# Patient Record
Sex: Female | Born: 1980
Health system: Southern US, Community
[De-identification: ages and names within clinical notes are randomized; demographics above are authoritative.]

## PROBLEM LIST (undated history)

## (undated) DIAGNOSIS — C50919 Malignant neoplasm of unspecified site of unspecified female breast: Secondary | ICD-10-CM

## (undated) DIAGNOSIS — N879 Dysplasia of cervix uteri, unspecified: Secondary | ICD-10-CM

## (undated) DIAGNOSIS — Z923 Personal history of irradiation: Secondary | ICD-10-CM

## (undated) HISTORY — PX: INTRAUTERINE DEVICE (IUD) INSERTION: SHX5877

## (undated) HISTORY — PX: REDUCTION MAMMAPLASTY: SUR839

## (undated) HISTORY — PX: OTHER SURGICAL HISTORY: SHX169

---
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
## 2009-02-05 ENCOUNTER — Ambulatory Visit: Payer: Self-pay | Admitting: Obstetrics and Gynecology

## 2009-02-05 ENCOUNTER — Encounter: Payer: Self-pay | Admitting: Obstetrics and Gynecology

## 2009-02-05 ENCOUNTER — Other Ambulatory Visit: Admission: RE | Admit: 2009-02-05 | Discharge: 2009-02-05 | Payer: Self-pay | Admitting: Obstetrics and Gynecology

## 2009-02-23 ENCOUNTER — Ambulatory Visit: Payer: Self-pay | Admitting: Obstetrics and Gynecology

## 2011-10-14 HISTORY — PX: OTHER SURGICAL HISTORY: SHX169

## 2011-12-24 ENCOUNTER — Ambulatory Visit: Payer: Self-pay | Admitting: Family Medicine

## 2012-02-27 ENCOUNTER — Encounter: Payer: Self-pay | Admitting: Gynecology

## 2012-03-02 ENCOUNTER — Encounter: Payer: Self-pay | Admitting: Obstetrics and Gynecology

## 2012-03-02 ENCOUNTER — Other Ambulatory Visit (HOSPITAL_COMMUNITY)
Admission: RE | Admit: 2012-03-02 | Discharge: 2012-03-02 | Disposition: A | Discharge status: Home / Self Care | Payer: BC Managed Care – PPO | Source: Ambulatory Visit | Attending: Obstetrics and Gynecology | Admitting: Obstetrics and Gynecology

## 2012-03-02 ENCOUNTER — Ambulatory Visit (INDEPENDENT_AMBULATORY_CARE_PROVIDER_SITE_OTHER): Payer: BC Managed Care – PPO | Admitting: Obstetrics and Gynecology

## 2012-03-02 VITALS — BP 112/74 | Ht 64.0 in | Wt 167.0 lb

## 2012-03-02 DIAGNOSIS — Z30431 Encounter for routine checking of intrauterine contraceptive device: Secondary | ICD-10-CM

## 2012-03-02 DIAGNOSIS — Z01419 Encounter for gynecological examination (general) (routine) without abnormal findings: Secondary | ICD-10-CM

## 2012-03-02 HISTORY — PX: IUD REMOVAL: SHX5392

## 2012-03-02 NOTE — Progress Notes (Signed)
Patient came to see me today for her annual GYN exam. She is happy with her Mirena IUD with minimal bleeding. However she and her husband are ready to start a family. She requested that it be removed. She is having no abnormal bleeding. She is having no pelvic pain. She is not having dyspareunia.  Physical examination: Kennon Portela present HEENT within normal limits. Neck: Thyroid not large. No masses. Supraclavicular nodes: not enlarged. Breasts: Examined in both sitting and lying  position. No skin changes and no masses. Abdomen: Soft no guarding rebound or masses or hernia. Pelvic: External: Within normal limits. BUS: Within normal limits. Vaginal:within normal limits. Good estrogen effect. No evidence of cystocele rectocele or enterocele. Cervix: clean. IUD string visible.  Uterus: Normal size and shape. Adnexa: No masses. Rectovaginal exam: Confirmatory and negative. Extremities: Within normal limits.  Assessment: Normal GYN exam  Plan: Mirena IUD removed without difficulty. Patient started on folic acid. Discussed toxo precautions. Discussed avoiding alcohol. Discussed getting pregnant.

## 2012-03-02 NOTE — Patient Instructions (Signed)
Start folic acid daily or .4mg m daily

## 2012-03-03 LAB — CBC WITH DIFFERENTIAL/PLATELET
Basophils Absolute: 0.1 10*3/uL (ref 0.0–0.1)
Basophils Relative: 1 % (ref 0–1)
Eosinophils Absolute: 0.3 10*3/uL (ref 0.0–0.7)
Eosinophils Relative: 4 % (ref 0–5)
HCT: 40 % (ref 36.0–46.0)
Hemoglobin: 13.7 g/dL (ref 12.0–15.0)
Lymphocytes Relative: 24 % (ref 12–46)
Lymphs Abs: 2.1 10*3/uL (ref 0.7–4.0)
MCH: 29.7 pg (ref 26.0–34.0)
MCHC: 34.3 g/dL (ref 30.0–36.0)
MCV: 86.6 fL (ref 78.0–100.0)
Monocytes Absolute: 0.5 10*3/uL (ref 0.1–1.0)
Monocytes Relative: 6 % (ref 3–12)
Neutro Abs: 5.5 10*3/uL (ref 1.7–7.7)
Neutrophils Relative %: 65 % (ref 43–77)
Platelets: 302 10*3/uL (ref 150–400)
RBC: 4.62 MIL/uL (ref 3.87–5.11)
RDW: 12.5 % (ref 11.5–15.5)
WBC: 8.4 10*3/uL (ref 4.0–10.5)

## 2012-03-03 LAB — RUBELLA SCREEN: Rubella: 22.2 IU/mL — ABNORMAL HIGH

## 2012-03-04 HISTORY — PX: WISDOM TOOTH EXTRACTION: SHX21

## 2012-03-15 ENCOUNTER — Ambulatory Visit (INDEPENDENT_AMBULATORY_CARE_PROVIDER_SITE_OTHER): Payer: BC Managed Care – PPO | Admitting: Obstetrics and Gynecology

## 2012-03-15 DIAGNOSIS — R87610 Atypical squamous cells of undetermined significance on cytologic smear of cervix (ASC-US): Secondary | ICD-10-CM

## 2012-03-15 NOTE — Progress Notes (Signed)
Subjective:     Patient ID: Grace Harvey, female   DOB: 02/03/1981, 31 y.o.   MRN: [id number]  HPIpatient's last Pap smear done in May showed CIN-2/CIN-3. The patient has never had an abnormal Pap smear previously. She comes in today for colposcopy.   Review of Systemssee last office visit     Objective:   Physical Exam  Genitourinary:         Assessment:    possible CIN 2/CIN-3    Plan:     Multiple biopsies obtained. Discussed with the patient in detail. Her disease is very exterior and peripheral. If she needs to be treated we will do CO2 laser.

## 2012-03-16 ENCOUNTER — Telehealth: Payer: Self-pay | Admitting: Obstetrics and Gynecology

## 2012-03-16 NOTE — Telephone Encounter (Signed)
Left message with surgery date and time and asked patient to call me. She had asked Dr Reece Agar to have me check ins benefits and I have that info for her as well.

## 2012-03-17 ENCOUNTER — Telehealth: Payer: Self-pay | Admitting: Obstetrics and Gynecology

## 2012-03-17 NOTE — Telephone Encounter (Signed)
Left message for patient to call. I left a message yesterday letting her know surgery date/time/facility but told her I had insurance info as she had requested and asked her to call for that and to let me know she got the message.

## 2012-03-17 NOTE — Telephone Encounter (Signed)
Patient informed surgery scheduled June 14 7:30am at [facility].  She was instructed NPO after midnight night before surgery and also to have someone to drive her home.  She will expect call from [facility] a day or two before.  Will call me if has any questions.

## 2012-03-19 ENCOUNTER — Encounter (HOSPITAL_BASED_OUTPATIENT_CLINIC_OR_DEPARTMENT_OTHER): Payer: Self-pay | Admitting: *Deleted

## 2012-03-22 ENCOUNTER — Encounter (HOSPITAL_BASED_OUTPATIENT_CLINIC_OR_DEPARTMENT_OTHER): Payer: Self-pay | Admitting: *Deleted

## 2012-03-22 NOTE — Progress Notes (Signed)
NPO AFTER MN. ARRIVES AT 0615. NEEDS HG .  PT WILL COME AND HAVE SERUM PREG. TO DRAWN ON THRUSDAY AFTER 1300.

## 2012-03-25 ENCOUNTER — Encounter (HOSPITAL_BASED_OUTPATIENT_CLINIC_OR_DEPARTMENT_OTHER)
Admission: RE | Admit: 2012-03-25 | Discharge: 2012-03-25 | Disposition: A | Discharge status: Home / Self Care | Payer: BC Managed Care – PPO | Source: Ambulatory Visit | Attending: Obstetrics and Gynecology | Admitting: Obstetrics and Gynecology

## 2012-03-25 NOTE — Progress Notes (Signed)
Left message on pt's cellphone to remind pt to come today before 5pm to get lab drawn for surgery tomorrow.

## 2012-03-25 NOTE — H&P (Signed)
  Chief complaint: High-grade cervical dysplasia  History of present illness: Patient is a 31 year old gravida 1 para 0 AB 1 who presented to the office for an annual wellness exam. Pap smear showed high-grade dysplasia. Patient underwent colposcopy with biopsy which confirmed high grade dysplasia and a very peripheral transformation zone with the dysplasia far from the external os. Due to its location and patient's desire to have children we elected to treat this with the CO2 laser of the cervix rather then LEEP which would destroy a significant amount of cervical tissue. The patient now enters the hospital for the above. Please see colposcopy picture in epic record.  Past medical history, family history, social history, and review of systems in epic chart and reviewed.  Physical examination: HEENT within normal limits. Neck: Thyroid not large. No masses. Supraclavicular nodes: not enlarged. Breasts: Examined in both sitting and lying  position. No skin changes and no masses. Abdomen: Soft no guarding rebound or masses or hernia. Pelvic: External: Within normal limits. BUS: Within normal limits. Vaginal:within normal limits. Good estrogen effect. No evidence of cystocele rectocele or enterocele. Cervix: clean. Uterus: Normal size and shape. Adnexa: No masses. Rectovaginal exam: Confirmatory and negative. Extremities: Within normal limits.  Assessment: High-grade cervical dysplasia  Plan: CO2 laser of the cervix.

## 2012-03-26 ENCOUNTER — Ambulatory Visit (HOSPITAL_BASED_OUTPATIENT_CLINIC_OR_DEPARTMENT_OTHER)
Admission: RE | Admit: 2012-03-26 | Discharge: 2012-03-26 | Disposition: A | Discharge status: Home / Self Care | Payer: BC Managed Care – PPO | Source: Ambulatory Visit | Attending: Obstetrics and Gynecology | Admitting: Obstetrics and Gynecology

## 2012-03-26 ENCOUNTER — Encounter (HOSPITAL_BASED_OUTPATIENT_CLINIC_OR_DEPARTMENT_OTHER): Payer: Self-pay | Admitting: *Deleted

## 2012-03-26 ENCOUNTER — Encounter (HOSPITAL_BASED_OUTPATIENT_CLINIC_OR_DEPARTMENT_OTHER)
Admission: RE | Disposition: A | Discharge status: Home / Self Care | Payer: Self-pay | Source: Ambulatory Visit | Attending: Obstetrics and Gynecology

## 2012-03-26 ENCOUNTER — Encounter (HOSPITAL_BASED_OUTPATIENT_CLINIC_OR_DEPARTMENT_OTHER): Payer: Self-pay | Admitting: Anesthesiology

## 2012-03-26 ENCOUNTER — Ambulatory Visit (HOSPITAL_BASED_OUTPATIENT_CLINIC_OR_DEPARTMENT_OTHER): Payer: BC Managed Care – PPO | Admitting: Anesthesiology

## 2012-03-26 DIAGNOSIS — Z01812 Encounter for preprocedural laboratory examination: Secondary | ICD-10-CM | POA: Insufficient documentation

## 2012-03-26 DIAGNOSIS — D069 Carcinoma in situ of cervix, unspecified: Secondary | ICD-10-CM

## 2012-03-26 DIAGNOSIS — R87613 High grade squamous intraepithelial lesion on cytologic smear of cervix (HGSIL): Secondary | ICD-10-CM | POA: Insufficient documentation

## 2012-03-26 HISTORY — DX: Dysplasia of cervix uteri, unspecified: N87.9

## 2012-03-26 SURGERY — CO2 LASER APPLICATION
Anesthesia: General | Site: Vagina | Wound class: Clean Contaminated

## 2012-03-26 MED ORDER — FENTANYL CITRATE 0.05 MG/ML IJ SOLN
INTRAMUSCULAR | Status: DC | PRN
Start: 2012-03-26 — End: 2012-03-26
  Administered 2012-03-26: 25 ug via INTRAVENOUS
  Administered 2012-03-26 (×2): 50 ug via INTRAVENOUS

## 2012-03-26 MED ORDER — KETOROLAC TROMETHAMINE 30 MG/ML IJ SOLN
INTRAMUSCULAR | Status: DC | PRN
  Administered 2012-03-26: 30 mg via INTRAVENOUS

## 2012-03-26 MED ORDER — FERRIC SUBSULFATE SOLN
Status: DC | PRN
  Administered 2012-03-26: 1

## 2012-03-26 MED ORDER — FENTANYL CITRATE 0.05 MG/ML IJ SOLN: 25.0000 ug | INTRAMUSCULAR | Status: DC | PRN

## 2012-03-26 MED ORDER — ONDANSETRON HCL 4 MG/2ML IJ SOLN
INTRAMUSCULAR | Status: DC | PRN
  Administered 2012-03-26: 4 mg via INTRAVENOUS

## 2012-03-26 MED ORDER — ACETIC ACID 5 % SOLN
Status: DC | PRN
  Administered 2012-03-26: 1 via TOPICAL

## 2012-03-26 MED ORDER — LIDOCAINE HCL (CARDIAC) 20 MG/ML IV SOLN
INTRAVENOUS | Status: DC | PRN
  Administered 2012-03-26: 60 mg via INTRAVENOUS

## 2012-03-26 MED ORDER — LACTATED RINGERS IV SOLN
INTRAVENOUS | Status: DC
  Administered 2012-03-26 (×2): via INTRAVENOUS

## 2012-03-26 MED ORDER — MIDAZOLAM HCL 5 MG/5ML IJ SOLN
INTRAMUSCULAR | Status: DC | PRN
  Administered 2012-03-26: 2 mg via INTRAVENOUS

## 2012-03-26 MED ORDER — PROPOFOL 10 MG/ML IV EMUL
INTRAVENOUS | Status: DC | PRN
  Administered 2012-03-26: 180 mg via INTRAVENOUS

## 2012-03-26 MED ORDER — DEXAMETHASONE SODIUM PHOSPHATE 4 MG/ML IJ SOLN
INTRAMUSCULAR | Status: DC | PRN
  Administered 2012-03-26: 8 mg via INTRAVENOUS

## 2012-03-26 MED ORDER — STERILE WATER FOR IRRIGATION IR SOLN
Status: DC | PRN
  Administered 2012-03-26: 500 mL

## 2012-03-26 SURGICAL SUPPLY — 36 items
APPLICATOR COTTON TIP 6IN STRL (MISCELLANEOUS) ×4 IMPLANT
BLADE SURG 15 STRL LF DISP TIS (BLADE) IMPLANT
BLADE SURG 15 STRL SS (BLADE)
CANISTER SUCTION 1200CC (MISCELLANEOUS) IMPLANT
CANISTER SUCTION 2500CC (MISCELLANEOUS) IMPLANT
CATH ROBINSON RED A/P 16FR (CATHETERS) IMPLANT
CLOTH BEACON ORANGE TIMEOUT ST (SAFETY) ×2 IMPLANT
DEPRESSOR TONGUE BLADE STERILE (MISCELLANEOUS) ×4 IMPLANT
DRAPE LG THREE QUARTER DISP (DRAPES) ×2 IMPLANT
DRAPE UNDERBUTTOCKS STRL (DRAPE) IMPLANT
DRESSING TELFA 8X3 (GAUZE/BANDAGES/DRESSINGS) IMPLANT
ELECT BALL LEEP 3MM BLK (ELECTRODE) IMPLANT
ELECT BALL LEEP 5MM RED (ELECTRODE) IMPLANT
ELECT NEEDLE TIP 2.8 STRL (NEEDLE) IMPLANT
ELECT REM PT RETURN 9FT ADLT (ELECTROSURGICAL)
ELECTRODE REM PT RTRN 9FT ADLT (ELECTROSURGICAL) IMPLANT
GLOVE ECLIPSE 7.0 STRL STRAW (GLOVE) ×4 IMPLANT
GLOVE INDICATOR 6.5 STRL GRN (GLOVE) ×8 IMPLANT
GLOVE INDICATOR 7.5 STRL GRN (GLOVE) ×2 IMPLANT
GOWN PREVENTION PLUS LG XLONG (DISPOSABLE) ×4 IMPLANT
LEGGING LITHOTOMY PAIR STRL (DRAPES) ×2 IMPLANT
NS IRRIG 500ML POUR BTL (IV SOLUTION) IMPLANT
PACK BASIN DAY SURGERY FS (CUSTOM PROCEDURE TRAY) ×2 IMPLANT
PAD OB MATERNITY 4.3X12.25 (PERSONAL CARE ITEMS) ×2 IMPLANT
PAD PREP 24X48 CUFFED NSTRL (MISCELLANEOUS) ×2 IMPLANT
PENCIL BUTTON HOLSTER BLD 10FT (ELECTRODE) IMPLANT
SCOPETTES 8  STERILE (MISCELLANEOUS) ×1
SCOPETTES 8 STERILE (MISCELLANEOUS) ×1 IMPLANT
SYR TB 1ML LL NO SAFETY (SYRINGE) IMPLANT
TOWEL OR 17X24 6PK STRL BLUE (TOWEL DISPOSABLE) ×4 IMPLANT
TRAY DSU PREP LF (CUSTOM PROCEDURE TRAY) ×2 IMPLANT
TUBE CONNECTING 12X1/4 (SUCTIONS) ×2 IMPLANT
VACUUM HOSE 7/8X10 W/ WAND (MISCELLANEOUS) IMPLANT
VACUUM HOSE/TUBING 7/8INX6FT (MISCELLANEOUS) ×2 IMPLANT
WATER STERILE IRR 500ML POUR (IV SOLUTION) ×2 IMPLANT
YANKAUER SUCT BULB TIP NO VENT (SUCTIONS) IMPLANT

## 2012-03-26 NOTE — Interval H&P Note (Signed)
History and Physical Interval Note:  03/26/2012 6:59 AM  Grace Harvey  has presented today for surgery, with the diagnosis of HIGH GRADE CERVICAL INTRAEPITHELIAL NEOPLASIA  The various methods of treatment have been discussed with the patient and family. After consideration of risks, benefits and other options for treatment, the patient has consented to  Procedure(s) (LRB): CO2 LASER APPLICATION (N/A) as a surgical intervention .  The patients' history has been reviewed, patient examined, no change in status, stable for surgery.  I have reviewed the patients' chart and labs.  Questions were answered to the patient's satisfaction.     Danese Dorsainvil L

## 2012-03-26 NOTE — Transfer of Care (Signed)
  Immediate Anesthesia Transfer of Care Note  Patient: Grace Harvey  Procedure(s) Performed: Procedure(s) (LRB): CO2 LASER APPLICATION (N/A)  Patient Location: PACU  Anesthesia Type: General  Level of Consciousness: awake, oriented, sedated and patient cooperative  Airway & Oxygen Therapy: Patient Spontanous Breathing and Patient connected to face mask oxygen  Post-op Assessment: Report given to PACU RN and Post -op Vital signs reviewed and stable  Post vital signs: Reviewed and stable  Complications: No apparent anesthesia complications

## 2012-03-26 NOTE — Anesthesia Procedure Notes (Signed)
Procedure Name: LMA Insertion Date/Time: 03/26/2012 7:33 AM Performed by: Renella Cunas D Pre-anesthesia Checklist: Patient identified, Emergency Drugs available, Suction available and Patient being monitored Patient Re-evaluated:Patient Re-evaluated prior to inductionOxygen Delivery Method: Circle System Utilized Preoxygenation: Pre-oxygenation with 100% oxygen Intubation Type: IV induction Ventilation: Mask ventilation without difficulty LMA: LMA inserted LMA Size: 4.0 Number of attempts: 1 Airway Equipment and Method: bite block Placement Confirmation: positive ETCO2 Tube secured with: Tape Dental Injury: Teeth and Oropharynx as per pre-operative assessment

## 2012-03-26 NOTE — Op Note (Signed)
Preoperative diagnoses: High grade cervical dysplasia Postoperative diagnosis high-grade cervical dysplasia Operation: CO2 laser ablation of cervix Surgeon: Dr. Eda Paschal Anesthesia: Gen.  Findings: At the time of surgery the patient was recolposcoped. Findings were the same as in the office. Her squamocolumnar junction was extremely exterior near the portio of the cervix. The same white epithelium which was identified in the office and biopsy positive for cervical dysplasia, high-grade, was seen. The areas involved were 6 to 9:00, and 12 to 3:00. The entire transformation zone could be seen.  Procedure: The patient was given general anesthesia and then placed in the dorsal lithotomy position. She underwent colposcopy with acetic acid. Findings were as noted above. Using a CO2 laser at 8 W continuous power attached to the microscope the entire area which was to be laser ablated was outlined. It was then divided into 4 quadrants with the laser. The 2 lower quadrants were lasered first. Following this the two upper quadrants were lasered. No bleeding was encountered. General depth of laser was 7-to 8 mm. However at the os which was far from the disease she was lasered to 4-5 mm. At the termination of procedure Monsel's was applied. Once again there was no bleeding. Blood loss was minimal. Patient left the operating room in satisfactory condition.

## 2012-03-26 NOTE — Anesthesia Postprocedure Evaluation (Signed)
  Anesthesia Post-op Note  Patient: Grace Harvey  Procedure(s) Performed: Procedure(s) (LRB): CO2 LASER APPLICATION (N/A)  Patient Location: PACU  Anesthesia Type: General  Level of Consciousness: oriented and sedated  Airway and Oxygen Therapy: Patient Spontanous Breathing  Post-op Pain: mild  Post-op Assessment: Post-op Vital signs reviewed, Patient's Cardiovascular Status Stable, Respiratory Function Stable and Patent Airway  Post-op Vital Signs: stable  Complications: No apparent anesthesia complications

## 2012-03-26 NOTE — Anesthesia Preprocedure Evaluation (Signed)
Anesthesia Evaluation  Patient identified by MRN, date of birth, ID band Patient awake    Reviewed: Allergy & Precautions, H&P , NPO status , Patient's Chart, lab work & pertinent test results, reviewed documented beta blocker date and time   Airway Mallampati: I TM Distance: >3 FB Neck ROM: Full    Dental  (+) Teeth Intact and Dental Advisory Given   Pulmonary neg pulmonary ROS,  breath sounds clear to auscultation        Cardiovascular negative cardio ROS  Rhythm:Regular Rate:Normal     Neuro/Psych negative neurological ROS  negative psych ROS   GI/Hepatic negative GI ROS, Neg liver ROS,   Endo/Other  negative endocrine ROS  Renal/GU negative Renal ROS   Cervical dysplasia    Musculoskeletal negative musculoskeletal ROS (+)   Abdominal   Peds negative pediatric ROS (+)  Hematology negative hematology ROS (+)   Anesthesia Other Findings Lower retainer  Reproductive/Obstetrics negative OB ROS                           Anesthesia Physical Anesthesia Plan  ASA: I  Anesthesia Plan: General   Post-op Pain Management:    Induction: Intravenous  Airway Management Planned: LMA  Additional Equipment:   Intra-op Plan:   Post-operative Plan: Extubation in OR  Informed Consent: I have reviewed the patients History and Physical, chart, labs and discussed the procedure including the risks, benefits and alternatives for the proposed anesthesia with the patient or authorized representative who has indicated his/her understanding and acceptance.   Dental advisory given  Plan Discussed with: CRNA and Surgeon  Anesthesia Plan Comments:         Anesthesia Quick Evaluation

## 2012-03-26 NOTE — Discharge Instructions (Addendum)
D & C Home care Instructions:   Personal hygiene:  Used sanitary napkins for vaginal drainage not tampons. Shower or tub bathe the day after your procedure. No douching until bleeding stops. Always wipe from front to back after  Elimination.  Activity: Do not drive or operate any equipment today. The effects of the anesthesia are still present and drowsiness may result. Rest today, not necessarily flat bed rest, just take it easy. You may resume your normal activity in one to 2 days.  Sexual activity: No intercourse for one week or as indicated by your physician  Diet: Eat a light diet as desired this evening. You may resume a regular diet tomorrow.  Return to work: One to 2 days.  General Expectations of your surgery: Vaginal bleeding should be no heavier than a normal period. Spotting may continue up to 10 days. Mild cramps may continue for a couple of days. You may have a regular period in 2-6 weeks.  Unexpected observations call your doctor if these occur: persistent or heavy bleeding. Severe abdominal cramping or pain. Elevation of temperature greater than 100F.  Call for an appointment in two weeks.    Patient's Signature_______________________________________________________  Nurse's Signature________________________________________________________  Post Anesthesia Home Care Instructions  Activity: Get plenty of rest for the remainder of the day. A responsible adult should stay with you for 24 hours following the procedure.  For the next 24 hours, DO NOT: -Drive a car -Advertising copywriter -Drink alcoholic beverages -Take any medication unless instructed by your physician -Make any legal decisions or sign important papers.  Meals: Start with liquid foods such as gelatin or soup. Progress to regular foods as tolerated. Avoid greasy, spicy, heavy foods. If nausea and/or vomiting occur, drink only clear liquids until the nausea and/or vomiting subsides. Call your physician  if vomiting continues.  Special Instructions/Symptoms: Your throat may feel dry or sore from the anesthesia or the breathing tube placed in your throat during surgery. If this causes discomfort, gargle with warm salt water. The discomfort should disappear within 24 hours.  Conization of the Cervix Conization is the cutting (excision) of a cone-shaped portion of the cervix. This procedure is usually done when there is abnormal bleeding from the cervix. It can also be done to evaluate an abnormal Pap smear or if an abnormality is seen on the cervix during an exam. Conization of the cervix is not done during a menstrual period or pregnancy. BEFORE THE PROCEDURE  Do not eat or drink anything for 6 to 8 hours before the procedure, especially if you are going to be given a drug to make you sleep (general anesthetic).   Do not take aspirin or blood thinners for at least a week before the procedure, or as directed.   Arrive at least an hour before the procedure to read and sign the necessary forms.   Arrange for someone to take you home after the procedure.   If you smoke, do not smoke for 2 weeks before the procedure.  LET YOUR CAREGIVER KNOW THE FOLLOWING:  Allergies to food or medications.   All the medications you are taking including over-the-counter and prescription medications, herbs, eyedrops, and creams.   You develop a cold or an infection.   If you are using illegal drugs or drinking too much alcohol.   Your smoking habits.   Previous problems with anesthetics including novocaine.   The possibility of being pregnant.   History of blood clots or other bleeding problems.  Other medical problems.  RISKS AND COMPLICATIONS   Bleeding.   Infection.   Damage to the cervix.   Injury to surrounding organs.   Problems with the anesthesia.  PROCEDURE Conization of the cervix can be done by:  Cold knife. This type cuts out the cervical canal and the transformation zone  (where the normal cells end and the abnormal cells begin) with a scalpel.   LEEP (electrocautery). This type is done with a thin wire that can cut and burn (cauterize) the cervical tissue with an electrical current.   Laser treatment. This type cuts and burns (cauterizes) the tissue of the cervix to prevent bleeding with a laser beam.  You will be given a drug to make you sleep (general anesthetic) for cold knife and laser treatments, and you will be given a numbing medication (local anesthetic) for the LEEP procedure. Conization is usually done using colposcopy. Colposcopy magnifies the cervix to see it more clearly. The tissue removed is examined under a microscope by a doctor Psychologist, educational). The pathologist will provide a report to your caregiver. This will help your caregiver decide if further treatment is necessary. This report will also help your caregiver decide on the best treatment if your results are abnormal.  AFTER THE PROCEDURE  If you had a general anesthetic, you may be groggy for a couple of hours after the procedure.   If you had a local anesthetic, you will be advised to rest at the surgical center or caregiver's office until you are stable and feel ready to go home.   Have someone take you home.   You may have some cramping for a couple of days.   You may have a bloody discharge or light bleeding for 2 to 4 weeks.   You may have a black discharge coming from the vagina. This is from the paste used on the cervix to prevent bleeding. This is normal discharge.  HOME CARE INSTRUCTIONS   Do not drive for 24 hours.   Avoid strenuous activities and exercises for at least 7 - 10 days.   Only take over-the-counter or prescription medicines for pain, discomfort, or fever as directed by your caregiver.   Do not take aspirin. It can cause or aggravate bleeding.   You may resume your usual diet.   Drink 6 to 8 glasses of fluid a day.   Rest and sleep the first 24 to 48 hours.     Take showers instead of baths until your caregiver gives you the okay.   Do not use tampons, douche or have intercourse for 4 weeks, or as advised by your caregiver.   Do not lift anything over 10 pounds (4.5 kg) for at least 7 to 10 days, or as advised by your caregiver.   Take your temperature twice a day, for 4 to 5 days. Write them down.   Do not drink or drive when taking pain medication.   If you develop constipation:   Take a mild laxative with the advice of your caregiver.   Eat bran foods.   Drink a lot of fluids.   Try to have someone with you or available for you the first 24 to 48 hours, especially if you had a general anesthetic.   Make sure you and your family understand everything about your surgery and recovery.   Follow your caregiver's advice regarding follow-up appointments and Pap smears.  SEEK MEDICAL CARE IF:   You develop a rash.   You are dizzy  or light-headed.   You feel sick to your stomach (nauseous).   You develop a bad smelling vaginal discharge.   You have an abnormal reaction or allergy to your medication.   You need stronger pain medication.  SEEK IMMEDIATE MEDICAL CARE IF:   You have blood clots or bleeding that is heavier than a normal menstrual period, or you develop bright red bleeding.   An oral temperature above 102 F (38.9 C) develops and persists for several hours.   You have increasing cramps.   You pass out.   You have painful or bloody urine.   You start throwing up (vomiting).   The pain is not relieved with your pain medication.  Not all test results are available during your visit. If your test results are not back during your the visit, make an appointment with your caregiver to find out the results. Do not assume everything is normal if you have not heard from your caregiver or the medical facility. It is important for you to follow up on all of your test results. Document Released: 07/09/2005 Document Revised:  09/18/2011 Document Reviewed: 04/30/2009 [facility] Patient Information 2012 [place], [place]. Post Anesthesia Home Care Instructions  Activity: Get plenty of rest for the remainder of the day. A responsible adult should stay with you for 24 hours following the procedure.  For the next 24 hours, DO NOT: -Drive a car -Advertising copywriter -Drink alcoholic beverages -Take any medication unless instructed by your physician -Make any legal decisions or sign important papers.  Meals: Start with liquid foods such as gelatin or soup. Progress to regular foods as tolerated. Avoid greasy, spicy, heavy foods. If nausea and/or vomiting occur, drink only clear liquids until the nausea and/or vomiting subsides. Call your physician if vomiting continues.  Special Instructions/Symptoms: Your throat may feel dry or sore from the anesthesia or the breathing tube placed in your throat during surgery. If this causes discomfort, gargle with warm salt water. The discomfort should disappear within 24 hours.

## 2012-04-08 ENCOUNTER — Ambulatory Visit (INDEPENDENT_AMBULATORY_CARE_PROVIDER_SITE_OTHER): Payer: BC Managed Care – PPO | Admitting: Obstetrics and Gynecology

## 2012-04-08 ENCOUNTER — Encounter: Payer: Self-pay | Admitting: Obstetrics and Gynecology

## 2012-04-08 DIAGNOSIS — N871 Moderate cervical dysplasia: Secondary | ICD-10-CM

## 2012-04-08 NOTE — Progress Notes (Signed)
Patient came to see me today for a postoperative visit after a CO2 laser of her cervix. She is doing fine.  Exam: Kennon Portela present.Pelvic exam: External: Within normal limits. BUS within normal limits. Vaginal exam: Within normal limits. Cervix: healing well. Uterus: Within normal limits. Adnexa: Within normal limits. Rectovaginal exam: Within normal limits.   Assessment: Normal postoperative course  Plan: Wait one week and then resume intercourse. I told her it is okay to get pregnant.

## 2012-05-27 ENCOUNTER — Ambulatory Visit (INDEPENDENT_AMBULATORY_CARE_PROVIDER_SITE_OTHER): Payer: BC Managed Care – PPO | Admitting: Obstetrics and Gynecology

## 2012-05-27 ENCOUNTER — Encounter: Payer: Self-pay | Admitting: Obstetrics and Gynecology

## 2012-05-27 DIAGNOSIS — N912 Amenorrhea, unspecified: Secondary | ICD-10-CM

## 2012-05-27 NOTE — Progress Notes (Signed)
Patient had her last normal menstrual period story 04/22/2012. She has had no bleeding since then. She has had 3 positive home pregnancy tests.  Exam:Pelvic exam: External within normal limits. BUS within normal limits. Vaginal exam within normal limits. Cervix is clean without lesions. Uterus is normal size and shape. Adnexa failed to reveal masses.   Assessment: Probable early  Pregnancy  Plan: Continue prenatal vitamins. Quantitative hCG. We will do ultrasound after quantitative hCG. She would like to deliver in [facility] and I gave her Dr. Harriett Sine Cheshire's name.

## 2012-05-27 NOTE — Patient Instructions (Addendum)
We will call you with results  

## 2012-06-09 ENCOUNTER — Other Ambulatory Visit: Payer: Self-pay | Admitting: Obstetrics and Gynecology

## 2012-06-09 DIAGNOSIS — N912 Amenorrhea, unspecified: Secondary | ICD-10-CM

## 2012-06-17 ENCOUNTER — Ambulatory Visit (INDEPENDENT_AMBULATORY_CARE_PROVIDER_SITE_OTHER): Payer: BC Managed Care – PPO | Admitting: Obstetrics and Gynecology

## 2012-06-17 ENCOUNTER — Other Ambulatory Visit (INDEPENDENT_AMBULATORY_CARE_PROVIDER_SITE_OTHER): Payer: BC Managed Care – PPO

## 2012-06-17 DIAGNOSIS — O34519 Maternal care for incarceration of gravid uterus, unspecified trimester: Secondary | ICD-10-CM

## 2012-06-17 DIAGNOSIS — O34539 Maternal care for retroversion of gravid uterus, unspecified trimester: Secondary | ICD-10-CM

## 2012-06-17 DIAGNOSIS — O3440 Maternal care for other abnormalities of cervix, unspecified trimester: Secondary | ICD-10-CM

## 2012-06-17 DIAGNOSIS — O2690 Pregnancy related conditions, unspecified, unspecified trimester: Secondary | ICD-10-CM

## 2012-06-17 DIAGNOSIS — N879 Dysplasia of cervix uteri, unspecified: Secondary | ICD-10-CM

## 2012-06-17 DIAGNOSIS — N912 Amenorrhea, unspecified: Secondary | ICD-10-CM

## 2012-06-17 LAB — US OB TRANSVAGINAL

## 2012-06-17 NOTE — Progress Notes (Signed)
Patient came back today for pelvic ultrasound. The patient had a laser of her cervix on 03/26/2012 for high-grade dysplasia. Her last menstrual period was July 11 which makes her 8 weeks today. On ultrasound a single intrauterine pregnancy was seen. It was 7 weeks today. Fetal pole and  fetal heart motion were seen. Her cervix was long and closed. The yolk sac appeared to be normal. Her right ovary was normal. Her left ovary showed a small corpus luteal cyst. Her cul-de-sac was free of fluid.  Assessment: Intrauterine pregnancy 7 weeks by ultrasound and 8 weeks by dates.  Plan: Patient reassured. Obviously with followup ultrasounds dates can be confirmed. This was her first cycle after the Mirena IUD was removed and she certainly could have ovulated a week late. She will continue her prenatal vitamins. She will get her Obstetrical  care at [facility]. She will call and make her first appointment.

## 2015-06-11 ENCOUNTER — Other Ambulatory Visit (HOSPITAL_COMMUNITY)
Admission: RE | Admit: 2015-06-11 | Discharge: 2015-06-11 | Disposition: A | Discharge status: Home / Self Care | Payer: BLUE CROSS/BLUE SHIELD | Source: Ambulatory Visit | Attending: Gynecology | Admitting: Gynecology

## 2015-06-11 ENCOUNTER — Encounter: Payer: Self-pay | Admitting: Gynecology

## 2015-06-11 ENCOUNTER — Ambulatory Visit (INDEPENDENT_AMBULATORY_CARE_PROVIDER_SITE_OTHER): Payer: BLUE CROSS/BLUE SHIELD | Admitting: Gynecology

## 2015-06-11 VITALS — BP 122/76 | Ht 65.5 in | Wt 159.0 lb

## 2015-06-11 DIAGNOSIS — Z1151 Encounter for screening for human papillomavirus (HPV): Secondary | ICD-10-CM | POA: Insufficient documentation

## 2015-06-11 DIAGNOSIS — Z01419 Encounter for gynecological examination (general) (routine) without abnormal findings: Secondary | ICD-10-CM | POA: Insufficient documentation

## 2015-06-11 LAB — CBC WITH DIFFERENTIAL/PLATELET
BASOS PCT: 1 % (ref 0–1)
Basophils Absolute: 0.1 10*3/uL (ref 0.0–0.1)
EOS ABS: 0.2 10*3/uL (ref 0.0–0.7)
Eosinophils Relative: 4 % (ref 0–5)
HCT: 42.9 % (ref 36.0–46.0)
Hemoglobin: 14.8 g/dL (ref 12.0–15.0)
LYMPHS ABS: 2.2 10*3/uL (ref 0.7–4.0)
Lymphocytes Relative: 36 % (ref 12–46)
MCH: 30.5 pg (ref 26.0–34.0)
MCHC: 34.5 g/dL (ref 30.0–36.0)
MCV: 88.5 fL (ref 78.0–100.0)
MONO ABS: 0.4 10*3/uL (ref 0.1–1.0)
MONOS PCT: 6 % (ref 3–12)
MPV: 9.8 fL (ref 8.6–12.4)
Neutro Abs: 3.3 10*3/uL (ref 1.7–7.7)
Neutrophils Relative %: 53 % (ref 43–77)
PLATELETS: 271 10*3/uL (ref 150–400)
RBC: 4.85 MIL/uL (ref 3.87–5.11)
RDW: 13.2 % (ref 11.5–15.5)
WBC: 6.2 10*3/uL (ref 4.0–10.5)

## 2015-06-11 LAB — COMPREHENSIVE METABOLIC PANEL
ALBUMIN: 4.4 g/dL (ref 3.6–5.1)
ALT: 9 U/L (ref 6–29)
AST: 12 U/L (ref 10–30)
Alkaline Phosphatase: 46 U/L (ref 33–115)
BUN: 11 mg/dL (ref 7–25)
CHLORIDE: 105 mmol/L (ref 98–110)
CO2: 26 mmol/L (ref 20–31)
CREATININE: 0.73 mg/dL (ref 0.50–1.10)
Calcium: 9.2 mg/dL (ref 8.6–10.2)
Glucose, Bld: 85 mg/dL (ref 65–99)
POTASSIUM: 4.3 mmol/L (ref 3.5–5.3)
SODIUM: 141 mmol/L (ref 135–146)
Total Bilirubin: 0.5 mg/dL (ref 0.2–1.2)
Total Protein: 7 g/dL (ref 6.1–8.1)

## 2015-06-11 LAB — LIPID PANEL
CHOL/HDL RATIO: 3.3 ratio (ref <=5.0)
CHOLESTEROL: 156 mg/dL (ref 125–200)
HDL: 48 mg/dL (ref >=46)
LDL CALC: 95 mg/dL (ref <130)
TRIGLYCERIDES: 67 mg/dL (ref <150)
VLDL: 13 mg/dL (ref <30)

## 2015-06-11 LAB — TSH: TSH: 1.181 u[IU]/mL (ref 0.350–4.500)

## 2015-06-11 NOTE — Progress Notes (Signed)
Grace Harvey 03/25/1981 [id number]   History:    34 y.o.  for annual gyn exam who has not been seen the office since 2013. 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 Patient subsequently underwent CO2 laser ablation for her cervical dysplasia by my partner Dr. Cherylann Banas. She reports normal Pap smears after that. She had a vaginal delivery at [facility] in 2014 and stated that her Pap smear was normal then. That same year she had a Mirena IUD placed and she's having normal menstrual cycles. She was otherwise asymptomatic today.  Past medical history,surgical history, family history and social history were all reviewed and documented in the EPIC chart.  Gynecologic History No LMP recorded. Patient is not currently having periods (Reason: IUD). Contraception: IUD Last Pap: 2014. Results were: normal Last mammogram: Not indicated. Results were: Not indicated  Obstetric History OB History  Gravida Para Term Preterm AB SAB TAB Ectopic Multiple Living  2 1   1     1     # Outcome Date GA Lbr Len/2nd Weight Sex Delivery Anes PTL Lv  2 AB           1 Para     F Vag-Spont          ROS: A ROS was performed and pertinent positives and negatives are included in the history.  GENERAL: No fevers or chills. HEENT: No change in vision, no earache, sore throat or sinus congestion. NECK: No pain or stiffness. CARDIOVASCULAR: No chest pain or pressure. No palpitations. PULMONARY: No shortness of breath, cough or wheeze. GASTROINTESTINAL: No abdominal pain, nausea, vomiting or diarrhea, melena or bright red blood per rectum. GENITOURINARY: No urinary frequency, urgency, hesitancy or dysuria. MUSCULOSKELETAL: No joint or muscle pain, no back pain, no recent trauma. DERMATOLOGIC: No rash, no itching, no lesions. ENDOCRINE: No polyuria, polydipsia, no heat or  cold intolerance. No recent change in weight. HEMATOLOGICAL: No anemia or easy bruising or bleeding. NEUROLOGIC: No headache, seizures, numbness, tingling or weakness. PSYCHIATRIC: No depression, no loss of interest in normal activity or change in sleep pattern.     Exam: chaperone present  BP 122/76 mmHg  Ht 5' 5.5" (1.664 m)  Wt 159 lb (72.122 kg)  BMI 26.05 kg/m2  Breastfeeding? Unknown  Body mass index is 26.05 kg/(m^2).  General appearance : Well developed well nourished female. No acute distress HEENT: Eyes: no retinal hemorrhage or exudates,  Neck supple, trachea midline, no carotid bruits, no thyroidmegaly Lungs: Clear to auscultation, no rhonchi or wheezes, or rib retractions  Heart: Regular rate and rhythm, no murmurs or gallops Breast:Examined in sitting and supine position were symmetrical in appearance, no palpable masses or tenderness,  no skin retraction, no nipple inversion, no nipple discharge, no skin discoloration, no axillary or supraclavicular lymphadenopathy Abdomen: no palpable masses or tenderness, no rebound or guarding Extremities: no edema or skin discoloration or tenderness  Pelvic:  Bartholin, Urethra, Skene Glands: Within normal limits             Vagina: No gross lesions or discharge  Cervix: No gross lesions or discharge, IUD string seen  Uterus  anteverted, normal size, shape and consistency, non-tender and mobile  Adnexa  Without masses or tenderness  Anus and perineum  normal   Rectovaginal  normal sphincter tone without palpated masses or tenderness  Hemoccult not indicated     Assessment/Plan:  34 y.o. female for annual exam with past history of CIN-1-CIN-2 treated with CO2 laser in 2013. Pap smear with HPV screen will be obtained today. The following screening labs were ordered today: Comprehensive metabolic panel, fasting lipid profile, TSH, CBC, and urinalysis.   Terrance Mass MD, 11:17 AM 06/11/2015

## 2015-06-13 LAB — CYTOLOGY - PAP

## 2016-01-29 ENCOUNTER — Other Ambulatory Visit: Payer: Self-pay | Admitting: Physical Medicine and Rehabilitation

## 2016-01-29 ENCOUNTER — Other Ambulatory Visit: Payer: Self-pay | Admitting: Physician Assistant

## 2016-01-29 DIAGNOSIS — M5136 Other intervertebral disc degeneration, lumbar region: Secondary | ICD-10-CM

## 2016-03-25 ENCOUNTER — Ambulatory Visit (INDEPENDENT_AMBULATORY_CARE_PROVIDER_SITE_OTHER): Payer: 59 | Admitting: Gynecology

## 2016-03-25 ENCOUNTER — Encounter: Payer: Self-pay | Admitting: Gynecology

## 2016-03-25 VITALS — BP 128/84

## 2016-03-25 DIAGNOSIS — N921 Excessive and frequent menstruation with irregular cycle: Secondary | ICD-10-CM | POA: Diagnosis not present

## 2016-03-25 DIAGNOSIS — Z975 Presence of (intrauterine) contraceptive device: Principal | ICD-10-CM

## 2016-03-25 LAB — CBC WITH DIFFERENTIAL/PLATELET
BASOS PCT: 0 %
Basophils Absolute: 0 cells/uL (ref 0–200)
EOS ABS: 195 {cells}/uL (ref 15–500)
EOS PCT: 3 %
HCT: 42.6 % (ref 35.0–45.0)
Hemoglobin: 14.3 g/dL (ref 11.7–15.5)
LYMPHS PCT: 29 %
Lymphs Abs: 1885 cells/uL (ref 850–3900)
MCH: 30 pg (ref 27.0–33.0)
MCHC: 33.6 g/dL (ref 32.0–36.0)
MCV: 89.3 fL (ref 80.0–100.0)
MONOS PCT: 9 %
MPV: 10.1 fL (ref 7.5–12.5)
Monocytes Absolute: 585 cells/uL (ref 200–950)
NEUTROS ABS: 3835 {cells}/uL (ref 1500–7800)
Neutrophils Relative %: 59 %
PLATELETS: 293 10*3/uL (ref 140–400)
RBC: 4.77 MIL/uL (ref 3.80–5.10)
RDW: 13.6 % (ref 11.0–15.0)
WBC: 6.5 10*3/uL (ref 3.8–10.8)

## 2016-03-25 MED ORDER — ESTRADIOL 0.5 MG PO TABS: ORAL_TABLET | ORAL | Status: DC

## 2016-03-25 MED ORDER — DOXYCYCLINE HYCLATE 100 MG PO CAPS: 100.0000 mg | ORAL_CAPSULE | Freq: Two times a day (BID) | ORAL | Status: DC

## 2016-03-25 NOTE — Progress Notes (Signed)
   HPI: Patient is a 35 year old who presented to the office stating that since March of this year she's been having irregular bleeding. She reports that 3 years ago at [facility] she had a Mirena IUD placed. I had seen her in the office on August 29 for her annual exam and her history was as follows:  . 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 Patient subsequently underwent CO2 laser ablation for her cervical dysplasia by my partner Dr. Cherylann Banas. She reports normal Pap smears after that. She had a vaginal delivery at [facility] in 2014 and stated that her Pap smear was normal then. That same year she had a Mirena IUD placed and she's having normal menstrual cycles.   She reports no unusual pelvic pain or any dyspareunia. She denies any easy bruisability.    ROS: A ROS was performed and pertinent positives and negatives are included in the history.  GENERAL: No fevers or chills. HEENT: No change in vision, no earache, sore throat or sinus congestion. NECK: No pain or stiffness. CARDIOVASCULAR: No chest pain or pressure. No palpitations. PULMONARY: No shortness of breath, cough or wheeze. GASTROINTESTINAL: No abdominal pain, nausea, vomiting or diarrhea, melena or bright red blood per rectum. GENITOURINARY: No urinary frequency, urgency, hesitancy or dysuria. MUSCULOSKELETAL: No joint or muscle pain, no back pain, no recent trauma. DERMATOLOGIC: No rash, no itching, no lesions. ENDOCRINE: No polyuria, polydipsia, no heat or cold intolerance. No recent change in weight. HEMATOLOGICAL: See above NEUROLOGIC: No headache, seizures, numbness, tingling or weakness. PSYCHIATRIC: No depression, no loss of interest in normal activity or change in sleep pattern.   PE: Blood pressure 128/84 Gen. appearance well-developed well-nourished female  with above mentioned complaint Back: No CVA tenderness Abdomen: Slight suprapubic pubic tenderness but no rebound or guarding Pelvic: Bartholin urethra Skene was within normal limits Vagina: No lesions or discharge Cervix: No lesions or discharge, IUD string visualized Uterus: Anteverted normal size shape and consistency nontender Adnexa: No palpable masses or tenderness Rectal exam: Not done  Assessment Plan: Patient on third year with Mirena IUD complaining of breakthrough bleeding since March. In the event of endometritis she will be placed on Vibramycin 100 mg twice a day for 7 days. 2 built of her endometrium on going to place her on Estrace 0.5 mg 1 by mouth daily for 25 days. She will return back to the office next week for an ultrasound to confirm that the IUD is in the proper position and to rule out any possibility underlying cyst. We will check her CBC today because of her breakthrough bleeding.   Because ofin  Greater than 50% of time was spent in counseling and coordinating care of this patient.   Time of consultation: 15   Minutes.

## 2016-03-25 NOTE — Patient Instructions (Signed)
Endometritis °Endometritis is an irritation, soreness, and swelling (inflammation) of the lining of the uterus (endometrium).  °CAUSES  °· Bacterial infections. °· Sexually transmitted infections (STIs). °· Having a miscarriage or childbirth, especially after a long labor or cesarean delivery. °· Certain gynecological procedures (such as dilation and curettage, hysteroscopy, or contraceptive insertion). °SIGNS AND SYMPTOMS  °· Fever. °· Lower abdominal or pelvic pain. °· Abnormal vaginal discharge or bleeding. °· Abdominal bloating (distention) or swelling. °· General discomfort or ill feeling. °· Discomfort with bowel movements. °DIAGNOSIS  °A physical and pelvic exam are performed. Other tests may include: °· Cultures from the cervix. °· Blood tests. °· Examining a tissue sample of the uterine lining (endometrial biopsy). °· Examining discharge under a microscope (wet prep). °· Laparoscopy. °TREATMENT  °Antibiotic medicines are usually given. Other treatments may include: °· Fluids through an IV tube inserted in your vein. °· Rest. °HOME CARE INSTRUCTIONS  °· Take over-the-counter or prescription medicines for pain, discomfort, or fever as directed by your health care provider. °· Take your antibiotics as directed. Finish them even if you start to feel better. °· Resume your normal diet and activities as directed or as tolerated. °· Do not douche or have sexual intercourse until your health care provider says it is okay. °· Do not have sexual intercourse until your partner has been treated if your endometritis is caused by an STI. °SEEK IMMEDIATE MEDICAL CARE IF:  °· You have swelling or increasing pain in the abdomen. °· You have a fever. °· You have bad smelling vaginal discharge, or you have an increased amount of discharge. °· You have abnormal vaginal bleeding. °· Your medicine is not helping with the pain. °· You experience any problems that may be related to the medicine you are taking. °· You have nausea  and vomiting, or you cannot keep foods down. °· You have pain with bowel movements. °MAKE SURE YOU:  °· Understand these instructions. °· Will watch your condition. °· Will get help right away if you are not doing well or get worse. °  °This information is not intended to replace advice given to you by your health care provider. Make sure you discuss any questions you have with your health care provider. °  °Document Released: 09/23/2001 Document Revised: 06/01/2013 Document Reviewed: 04/28/2013 °Elsevier Interactive Patient Education ©2016 [employer] ° °

## 2016-04-02 ENCOUNTER — Ambulatory Visit (INDEPENDENT_AMBULATORY_CARE_PROVIDER_SITE_OTHER): Payer: 59 | Admitting: Gynecology

## 2016-04-02 ENCOUNTER — Ambulatory Visit (INDEPENDENT_AMBULATORY_CARE_PROVIDER_SITE_OTHER): Payer: 59

## 2016-04-02 ENCOUNTER — Encounter: Payer: Self-pay | Admitting: Gynecology

## 2016-04-02 VITALS — BP 124/82

## 2016-04-02 DIAGNOSIS — N921 Excessive and frequent menstruation with irregular cycle: Secondary | ICD-10-CM | POA: Diagnosis not present

## 2016-04-02 DIAGNOSIS — Z30431 Encounter for routine checking of intrauterine contraceptive device: Secondary | ICD-10-CM | POA: Diagnosis not present

## 2016-04-02 DIAGNOSIS — Z8742 Personal history of other diseases of the female genital tract: Secondary | ICD-10-CM | POA: Diagnosis not present

## 2016-04-02 DIAGNOSIS — Z975 Presence of (intrauterine) contraceptive device: Secondary | ICD-10-CM

## 2016-04-02 NOTE — Progress Notes (Signed)
   Patient is a 35 year old that presented to the office for follow-up ultrasound. She was seen the office on June 13 was having breakthrough bleeding with a Mirena IUD for approximately 3 months. Some mild cramping. No fever, chills, nausea, or vomiting. She was treated for suspected endometritis but prescribing Vibramycin 100 mg twice a day for 7 days. And also to build up her endometrium she was prescribed Estrace 0.5 mg 1 by mouth daily for 25 days. She reports no further bleeding no cramping and is doing well and is here for that ultrasound.  Ultrasound: Uterus measures 7.3 x 5.2 x 4.2 cm with endometrial stripe of 3 mm. IUD was seen in the proper position both right and left ovary normal. No fluid in the cul-de-sac and no adnexal masses.  Assessment/plan: Patient status post treatment for endometritis has Mirena IUD. Has responded to antibiotic Vibramycin 100 mg twice a day for 7 days. Currently on Estrace 0.5 mg daily for 25 days to build up her endometrium. Patient was asymptomatic. Patient otherwise scheduled to return back to the office next year for annual exam or when necessary. Of note the IUD was in the proper position.  Time of consultation 10 minutes

## 2017-02-25 ENCOUNTER — Encounter: Payer: Self-pay | Admitting: Gynecology

## 2017-04-03 ENCOUNTER — Ambulatory Visit (INDEPENDENT_AMBULATORY_CARE_PROVIDER_SITE_OTHER): Payer: 59 | Admitting: Gynecology

## 2017-04-03 ENCOUNTER — Encounter: Payer: Self-pay | Admitting: Gynecology

## 2017-04-03 VITALS — BP 122/80

## 2017-04-03 DIAGNOSIS — Z30432 Encounter for removal of intrauterine contraceptive device: Secondary | ICD-10-CM

## 2017-04-03 NOTE — Progress Notes (Signed)
   Patient is a 36 year old gravida 1 para 1 who presented to the office today requesting that her Mirena IUD removed since she is contemplating getting pregnant. She has started her prenatal vitamin. The patient had had the Mirena IUD for 3 years.  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 Patient subsequently underwent CO2 laser ablation for her cervical dysplasia by my partner Dr. Cherylann Banas. She reports normal Pap smears after that. She had a vaginal delivery at [facility] in 2014 and stated that her Pap smear was normal then.   Patient is due for her annual exam in August this year.  Pelvic: External genitalia with nontender mouth and noted. The speculum was introduced into the vagina. No vaginal cervical lesions were noted. The IUD string was barely seen. The cervix was cleansed Betadine solution and a [place] clamp was introduced into the internal cervical os to grab the IUD string. The IUD was retrieved shown to the patient discarded.  Assessment/plan: Mirena IUD that had been placed at [facility] 3 years ago was removed today per patient's request since she is contemplating getting pregnant. She was reminded on continuation of the prenatal vitamins. We also discussed timing of intercourse by using the ovulation predictor kit. She is due for her annual exam at the end of August. Patient with history of cervical dysplasia as noted above.

## 2017-06-11 ENCOUNTER — Encounter: Payer: Self-pay | Admitting: Obstetrics & Gynecology

## 2017-06-11 ENCOUNTER — Ambulatory Visit (INDEPENDENT_AMBULATORY_CARE_PROVIDER_SITE_OTHER): Payer: 59 | Admitting: Obstetrics & Gynecology

## 2017-06-11 VITALS — BP 138/90 | Ht 66.75 in | Wt 171.0 lb

## 2017-06-11 DIAGNOSIS — Z01419 Encounter for gynecological examination (general) (routine) without abnormal findings: Secondary | ICD-10-CM | POA: Diagnosis not present

## 2017-06-11 DIAGNOSIS — Z3169 Encounter for other general counseling and advice on procreation: Secondary | ICD-10-CM | POA: Diagnosis not present

## 2017-06-11 DIAGNOSIS — Z1151 Encounter for screening for human papillomavirus (HPV): Secondary | ICD-10-CM

## 2017-06-11 NOTE — Patient Instructions (Signed)
1. Encounter for routine gynecological examination with Papanicolaou smear of cervix Normal gyn exam.  Pap/HPV HR done.  Breasts wnl.  2. Encounter for preconception consultation Attempting conception.  On PNVs, good nutrition and physically active.  Previous normal term SVD.  No risk factor except will be at or >35 yo at delivery.  Grace Harvey, it was a pleasure to meet you today!  I will inform you of your results as soon as available.   Eating Plan for Pregnant Women While you are pregnant, your body will require additional nutrition to help support your growing baby. It is recommended that you consume:  150 additional calories each day during your first trimester.  300 additional calories each day during your second trimester.  300 additional calories each day during your third trimester.  Eating a healthy, well-balanced diet is very important for your health and for your baby's health. You also have a higher need for some vitamins and minerals, such as folic acid, calcium, iron, and vitamin D. What do I need to know about eating during pregnancy?  Do not try to lose weight or go on a diet during pregnancy.  Choose healthy, nutritious foods. Choose  of a sandwich with a glass of milk instead of a candy bar or a high-calorie sugar-sweetened beverage.  Limit your overall intake of foods that have "empty calories." These are foods that have little nutritional value, such as sweets, desserts, candies, sugar-sweetened beverages, and fried foods.  Eat a variety of foods, especially fruits and vegetables.  Take a prenatal vitamin to help meet the additional needs during pregnancy, specifically for folic acid, iron, calcium, and vitamin D.  Remember to stay active. Ask your health care provider for exercise recommendations that are specific to you.  Practice good food safety and cleanliness, such as washing your hands before you eat and after you prepare raw meat. This helps to prevent  foodborne illnesses, such as listeriosis, that can be very dangerous for your baby. Ask your health care provider for more information about listeriosis. What does 150 extra calories look like? Healthy options for an additional 150 calories each day could be any of the following:  Plain low-fat yogurt (6-8 oz) with  cup of berries.  1 apple with 2 teaspoons of peanut butter.  Cut-up vegetables with  cup of hummus.  Low-fat chocolate milk (8 oz or 1 cup).  1 string cheese with 1 medium orange.   of a peanut butter and jelly sandwich on whole-wheat bread (1 tsp of peanut butter).  For 300 calories, you could eat two of those healthy options each day. What is a healthy amount of weight to gain? The recommended amount of weight for you to gain is based on your pre-pregnancy BMI. If your pre-pregnancy BMI was:  Less than 18 (underweight), you should gain 28-40 lb.  18-24.9 (normal), you should gain 25-35 lb.  25-29.9 (overweight), you should gain 15-25 lb.  Greater than 30 (obese), you should gain 11-20 lb.  What if I am having twins or multiples? Generally, pregnant women who will be having twins or multiples may need to increase their daily calories by 300-600 calories each day. The recommended range for total weight gain is 25-54 lb, depending on your pre-pregnancy BMI. Talk with your health care provider for specific guidance about additional nutritional needs, weight gain, and exercise during your pregnancy. What foods can I eat? Grains Any grains. Try to choose whole grains, such as whole-wheat bread, oatmeal, or brown rice.  Vegetables Any vegetables. Try to eat a variety of colors and types of vegetables to get a full range of vitamins and minerals. Remember to wash your vegetables well before eating. Fruits Any fruits. Try to eat a variety of colors and types of fruit to get a full range of vitamins and minerals. Remember to wash your fruits well before eating. Meats and  Other Protein Sources Lean meats, including chicken, [place], fish, and lean cuts of beef, veal, or pork. Make sure that all meats are cooked to "well done." Tofu. Tempeh. Beans. Eggs. Peanut butter and other nut butters. Seafood, such as shrimp, crab, and lobster. If you choose fish, select types that are higher in omega-3 fatty acids, including salmon, herring, mussels, trout, sardines, and pollock. Make sure that all meats are cooked to food-safe temperatures. Dairy Pasteurized milk and milk alternatives. Pasteurized yogurt and pasteurized cheese. Cottage cheese. Sour cream. Beverages Water. Juices that contain 100% fruit juice or vegetable juice. Caffeine-free teas and decaffeinated coffee. Drinks that contain caffeine are okay to drink, but it is better to avoid caffeine. Keep your total caffeine intake to less than 200 mg each day (12 oz of coffee, tea, or soda) or as directed by your health care provider. Condiments Any pasteurized condiments. Sweets and Desserts Any sweets and desserts. Fats and Oils Any fats and oils. The items listed above may not be a complete list of recommended foods or beverages. Contact your dietitian for more options. What foods are not recommended? Vegetables Unpasteurized (raw) vegetable juices. Fruits Unpasteurized (raw) fruit juices. Meats and Other Protein Sources Cured meats that have nitrates, such as bacon, salami, and hotdogs. Luncheon meats, bologna, or other deli meats (unless they are reheated until they are steaming hot). Refrigerated pate, meat spreads from a meat counter, smoked seafood that is found in the refrigerated section of a store. Raw fish, such as sushi or sashimi. High mercury content fish, such as tilefish, shark, swordfish, and king mackerel. Raw meats, such as tuna or beef tartare. Undercooked meats and poultry. Make sure that all meats are cooked to food-safe temperatures. Dairy Unpasteurized (raw) milk and any foods that have raw  milk in them. Soft cheeses, such as feta, queso blanco, queso fresco, Brie, Camembert cheeses, blue-veined cheeses, and Panela cheese (unless it is made with pasteurized milk, which must be stated on the label). Beverages Alcohol. Sugar-sweetened beverages, such as sodas, teas, or energy drinks. Condiments Homemade fermented foods and drinks, such as pickles, sauerkraut, or kombucha drinks. (Store-bought pasteurized versions of these are okay.) Other Salads that are made in the store, such as ham salad, chicken salad, egg salad, tuna salad, and seafood salad. The items listed above may not be a complete list of foods and beverages to avoid. Contact your dietitian for more information. This information is not intended to replace advice given to you by your health care provider. Make sure you discuss any questions you have with your health care provider. Document Released: 07/14/2014 Document Revised: 03/06/2016 Document Reviewed: 03/14/2014 Elsevier Interactive Patient Education  2018 [employer]. Exercise During Pregnancy For people of all ages, exercise is an important part of being healthy. Exercise improves heart and lung function and helps to maintain strength, flexibility, and a healthy body weight. Exercise also boosts energy levels and elevates mood. For most women, maintaining an exercise routine throughout pregnancy is recommended. It is only on rare occasions and with certain medical conditions or pregnancy complications that women may be asked to limit or avoid  exercise during pregnancy. What are some other benefits to exercising during pregnancy? Along with maintaining strength and flexibility, exercising throughout pregnancy can help to:  Keep strength in muscles that are very important during labor and childbirth.  Decrease low back pain during pregnancy.  Decrease the risk of developing gestational diabetes mellitus (GDM).  Improve blood sugar (glucose) control for women who  have GDM.  Decrease the risk of developing preeclampsia. This is a serious condition that causes high blood pressure along with other symptoms, such as swelling and headaches.  Decrease the risk of cesarean delivery.  Speed up the recovery after giving birth.  How often should I exercise? Unless your health care provider gives you different instructions, you should try to exercise on most days or all days of the week. In general, try to exercise with moderate intensity for about 150 minutes per week. This can be spread out across several days, such as exercising for 30 minutes per day on 5 days of each week. You can tell that you are exercising at a moderate intensity if you have a higher heart rate and faster breathing, but you are still able to hold a conversation. What types of moderate-intensity exercise are recommended during pregnancy? There are many types of exercise that are safe for you to do during pregnancy. Unless your health care provider gives you different instructions, do a variety of exercises that safely increase your heart and breathing (cardiopulmonary) rates and help you to build and maintain muscle strength (strength training). You should always be able to talk in full sentences while exercising during pregnancy. Some examples of exercising that is safe to do during pregnancy include:  Brisk walking or hiking.  Swimming.  Water aerobics.  Riding a stationary bike.  Strength training.  Modified yoga or Pilates. Tell your instructor that you are pregnant. Avoid overstretching and avoid lying on your back for long periods of time.  Running or jogging. Only choose this type of exercise if: ? You ran or jogged regularly before your pregnancy. ? You can run or jog and still talk in complete sentences.  What types of exercise should I not do during pregnancy? Depending on your level of fitness and whether you exercised regularly before your pregnancy, you may be advised  to limit vigorous-intensity exercise during your pregnancy. You can tell that you are exercising at a vigorous intensity if you are breathing much harder and faster and cannot hold a conversation while exercising. Some examples of exercising that you should avoid during pregnancy include:  Contact sports.  Activities that place you at risk for falling on or being hit in the belly, such as downhill skiing, water skiing, surfing, rock climbing, cycling, gymnastics, and horseback riding.  Scuba diving.  Sky diving.  Yoga or Pilates in a room that is heated to extreme temperatures ("hot yoga" or "hot Pilates").  Jogging or running, unless you ran or jogged regularly before your pregnancy. While jogging or running, you should always be able to talk in full sentences. Do not run or jog so vigorously that you are unable to have a conversation.  If you are not used to exercising at elevation (more than 6,000 feet above sea level), do not do so during your pregnancy.  When should I avoid exercising during pregnancy? Certain medical conditions can make it unsafe to exercise during pregnancy, or they may increase your risk of miscarriage or early labor and birth. Some of these conditions include:  Some types of heart  disease.  Some types of lung disease.  Placenta previa. This is when the placenta partially or completely covers the opening of the uterus (cervix).  Frequent bleeding from the vagina during your pregnancy.  Incompetent cervix. This is when your cervix does not remain as tightly closed during pregnancy as it should.  Premature labor.  Ruptured membranes. This is when the protective sac (amniotic sac) opens up and amniotic fluid leaks from your vagina.  Severely low blood count (anemia).  Preeclampsia or pregnancy-caused high blood pressure.  Carrying more than one baby (multiple gestation) and having an additional risk of early labor.  Poorly controlled diabetes.  Being  severely underweight or severely overweight.  Intrauterine growth restriction. This is when your baby's growth and development during pregnancy are slower than expected.  Other medical conditions. Ask your health care provider if any apply to you.  What else should I know about exercising during pregnancy? You should take these precautions while exercising during pregnancy:  Avoid overheating. ? Wear loose-fitting, breathable clothes. ? Do not exercise in very high temperatures.  Avoid dehydration. Drink enough water before, during, and after exercise to keep your urine clear or pale yellow.  Avoid overstretching. Because of hormone changes during pregnancy, it is easy to overstretch muscles, tendons, and ligaments during pregnancy.  Start slowly and ask your health care provider to recommend types of exercise that are safe for you, if exercising regularly is new for you.  Pregnancy is not a time for exercising to lose weight. When should I seek medical care? You should stop exercising and call your health care provider if you have any unusual symptoms, such as:  Mild uterine contractions or abdominal cramping.  Dizziness that does not improve with rest.  When should I seek immediate medical care? You should stop exercising and call your local emergency services (911 in the [place] if you have any unusual symptoms, such as:  Sudden, severe pain in your low back or your belly.  Uterine contractions or abdominal cramping that do not improve with rest.  Chest pain.  Bleeding or fluid leaking from your vagina.  Shortness of breath.  This information is not intended to replace advice given to you by your health care provider. Make sure you discuss any questions you have with your health care provider. Document Released: 09/29/2005 Document Revised: 02/27/2016 Document Reviewed: 12/07/2014 Elsevier Interactive Patient Education  [employer].

## 2017-06-11 NOTE — Addendum Note (Signed)
Addended by: Thurnell Garbe A on: 06/11/2017 10:14 AM   Modules accepted: Orders

## 2017-06-11 NOTE — Progress Notes (Signed)
Grace Harvey 1981-06-04 [id number]   History:    36 y.o. G2P1A1L1 Married.  RP:  Established patient presenting for annual gyn exam   HPI:  Removed IUD 04/03/2017 to attempt conception.  LMP 05/29/2017 normal.  No pelvic pain.  Normal vaginal secretions.  On PNVs.  Good nutrition, physically active.  Normal term SVD with previous pregnancy.  No difficulty becoming pregnant.  Mictions/BMs wnl.   Past medical history,surgical history, family history and social history were all reviewed and documented in the EPIC chart.  Gynecologic History Patient's last menstrual period was 05/29/2017. Contraception: none Last Pap: 05/2015. Results were: normal Last mammogram: Never.   Obstetric History OB History  Gravida Para Term Preterm AB Living  2 1     1 1   SAB TAB Ectopic Multiple Live Births               # Outcome Date GA Lbr Len/2nd Weight Sex Delivery Anes PTL Lv  2 AB           1 Para     F Vag-Spont          ROS: A ROS was performed and pertinent positives and negatives are included in the history.  GENERAL: No fevers or chills. HEENT: No change in vision, no earache, sore throat or sinus congestion. NECK: No pain or stiffness. CARDIOVASCULAR: No chest pain or pressure. No palpitations. PULMONARY: No shortness of breath, cough or wheeze. GASTROINTESTINAL: No abdominal pain, nausea, vomiting or diarrhea, melena or bright red blood per rectum. GENITOURINARY: No urinary frequency, urgency, hesitancy or dysuria. MUSCULOSKELETAL: No joint or muscle pain, no back pain, no recent trauma. DERMATOLOGIC: No rash, no itching, no lesions. ENDOCRINE: No polyuria, polydipsia, no heat or cold intolerance. No recent change in weight. HEMATOLOGICAL: No anemia or easy bruising or bleeding. NEUROLOGIC: No headache, seizures, numbness, tingling or weakness. PSYCHIATRIC: No depression, no loss of interest in normal activity or change in sleep pattern.     Exam:   BP 138/90   Ht 5' 6.75" (1.695 m)    Wt 171 lb (77.6 kg)   LMP 05/29/2017   BMI 26.98 kg/m   Body mass index is 26.98 kg/m.  General appearance : Well developed well nourished female. No acute distress HEENT: Eyes: no retinal hemorrhage or exudates,  Neck supple, trachea midline, no carotid bruits, no thyroidmegaly Lungs: Clear to auscultation, no rhonchi or wheezes, or rib retractions  Heart: Regular rate and rhythm, no murmurs or gallops Breast:Examined in sitting and supine position were symmetrical in appearance, no palpable masses or tenderness,  no skin retraction, no nipple inversion, no nipple discharge, no skin discoloration, no axillary or supraclavicular lymphadenopathy Abdomen: no palpable masses or tenderness, no rebound or guarding Extremities: no edema or skin discoloration or tenderness  Pelvic: Vulva normal  Bartholin, Urethra, Skene Glands: Within normal limits             Vagina: No gross lesions or discharge  Cervix: No gross lesions or discharge.  Pap/HPV HR done.  Uterus  AV, normal size, shape and consistency, non-tender and mobile  Adnexa  Without masses or tenderness  Anus and perineum  normal     Assessment/Plan:  36 y.o. female for annual exam   1. Encounter for routine gynecological examination with Papanicolaou smear of cervix Normal gyn exam.  Pap/HPV HR done.  Breasts wnl.  2. Encounter for preconception consultation Attempting conception.  On PNVs, good nutrition and physically active.  Previous normal  term SVD.  No risk factor except will be at or >37 yo at delivery.  Princess Bruins MD, 9:41 AM 06/11/2017

## 2017-06-16 LAB — PAP, TP IMAGING W/ HPV RNA, RFLX HPV TYPE 16,18/45: HPV MRNA, HIGH RISK: NOT DETECTED

## 2017-07-30 ENCOUNTER — Encounter: Payer: Self-pay | Admitting: Obstetrics & Gynecology

## 2017-07-30 ENCOUNTER — Ambulatory Visit (INDEPENDENT_AMBULATORY_CARE_PROVIDER_SITE_OTHER): Payer: 59 | Admitting: Obstetrics & Gynecology

## 2017-07-30 VITALS — BP 130/84

## 2017-07-30 DIAGNOSIS — N912 Amenorrhea, unspecified: Secondary | ICD-10-CM

## 2017-07-30 DIAGNOSIS — Z3200 Encounter for pregnancy test, result unknown: Secondary | ICD-10-CM

## 2017-07-30 DIAGNOSIS — Z23 Encounter for immunization: Secondary | ICD-10-CM

## 2017-07-30 LAB — PREGNANCY, URINE: PREG TEST UR: POSITIVE — AB

## 2017-07-30 NOTE — Progress Notes (Signed)
    Grace Harvey [birth year] [id number]        36 y.o.  G2P1L1 Married  RP:  Positive HPT  HPI:  LMP 06/20/2017.  Normal regular menses.  Attempting conception.  Recent HPT pos.  No pelvic pain.  No vaginal bleeding.  Very mild nausea, no vomiting.  On PNVs.  Previous pregnancy FT SVD.  Daughter was 6+ Lbs.  No Cx.  Delivered at [facility] because lived in that area at that time.  Past medical history,surgical history, problem list, medications, allergies, family history and social history were all reviewed and documented in the EPIC chart.  Directed ROS with pertinent positives and negatives documented in the history of present illness/assessment and plan.  Exam:  Vitals:   07/30/17 1525  BP: 130/84   General appearance:  Normal  UPT positive   Assessment/Plan:  36 y.o. G2P0011   1. Encounter for confirmation of pregnancy test result with physical examination 5 wks 5/7 pregnant.  No vaginal bleeding, no vomiting.  UPT pos.  PNVs.  F/U at 7+ wks for Ob US confirm viability/dating.  Ob care discussed.  May choose Ob care at [place] with [facility] for [facility] delivery. - US OB Transvaginal; Future  2. Amenorrhea Pregnant - Pregnancy, urine - US OB Transvaginal; Future  3. Flu vaccine need  - Flu Vaccine QUAD 36+ mos IM (Fluarix, Quad PF)  Counseling >50% x 15 minutes  Princess Bruins MD, 3:40 PM 07/30/2017

## 2017-07-30 NOTE — Patient Instructions (Signed)
1. Encounter for confirmation of pregnancy test result with physical examination 5 wks 5/7 pregnant.  No vaginal bleeding, no vomiting.  UPT pos.  PNVs.  F/U at 7+ wks for Ob US confirm viability/dating.  Ob care discussed.  May choose Ob care at [place] with [facility] delivery. - US OB Transvaginal; Future  2. Amenorrhea Pregnant - Pregnancy, urine - US OB Transvaginal; Future  3. Flu vaccine need  - Flu Vaccine QUAD 36+ mos IM (Fluarix, Quad PF)  Grace Harvey, it was a pleasure to see you today!  Congratulations!     Eating Plan for Pregnant Women While you are pregnant, your body will require additional nutrition to help support your growing baby. It is recommended that you consume:  150 additional calories each day during your first trimester.  300 additional calories each day during your second trimester.  300 additional calories each day during your third trimester.  Eating a healthy, well-balanced diet is very important for your health and for your baby's health. You also have a higher need for some vitamins and minerals, such as folic acid, calcium, iron, and vitamin D. What do I need to know about eating during pregnancy?  Do not try to lose weight or go on a diet during pregnancy.  Choose healthy, nutritious foods. Choose  of a sandwich with a glass of milk instead of a candy bar or a high-calorie sugar-sweetened beverage.  Limit your overall intake of foods that have "empty calories." These are foods that have little nutritional value, such as sweets, desserts, candies, sugar-sweetened beverages, and fried foods.  Eat a variety of foods, especially fruits and vegetables.  Take a prenatal vitamin to help meet the additional needs during pregnancy, specifically for folic acid, iron, calcium, and vitamin D.  Remember to stay active. Ask your health care provider for exercise recommendations that are specific to you.  Practice good food  safety and cleanliness, such as washing your hands before you eat and after you prepare raw meat. This helps to prevent foodborne illnesses, such as listeriosis, that can be very dangerous for your baby. Ask your health care provider for more information about listeriosis. What does 150 extra calories look like? Healthy options for an additional 150 calories each day could be any of the following:  Plain low-fat yogurt (6-8 oz) with  cup of berries.  1 apple with 2 teaspoons of peanut butter.  Cut-up vegetables with  cup of hummus.  Low-fat chocolate milk (8 oz or 1 cup).  1 string cheese with 1 medium orange.   of a peanut butter and jelly sandwich on whole-wheat bread (1 tsp of peanut butter).  For 300 calories, you could eat two of those healthy options each day. What is a healthy amount of weight to gain? The recommended amount of weight for you to gain is based on your pre-pregnancy BMI. If your pre-pregnancy BMI was:  Less than 18 (underweight), you should gain 28-40 lb.  18-24.9 (normal), you should gain 25-35 lb.  25-29.9 (overweight), you should gain 15-25 lb.  Greater than 30 (obese), you should gain 11-20 lb.  What if I am having twins or multiples? Generally, pregnant women who will be having twins or multiples may need to increase their daily calories by 300-600 calories each day. The recommended range for total weight gain is 25-54 lb, depending on your pre-pregnancy BMI. Talk with your health care provider for specific guidance about additional nutritional needs, weight gain,  and exercise during your pregnancy. What foods can I eat? Grains Any grains. Try to choose whole grains, such as whole-wheat bread, oatmeal, or brown rice. Vegetables Any vegetables. Try to eat a variety of colors and types of vegetables to get a full range of vitamins and minerals. Remember to wash your vegetables well before eating. Fruits Any fruits. Try to eat a variety of colors and  types of fruit to get a full range of vitamins and minerals. Remember to wash your fruits well before eating. Meats and Other Protein Sources Lean meats, including chicken, [place], fish, and lean cuts of beef, veal, or pork. Make sure that all meats are cooked to "well done." Tofu. Tempeh. Beans. Eggs. Peanut butter and other nut butters. Seafood, such as shrimp, crab, and lobster. If you choose fish, select types that are higher in omega-3 fatty acids, including salmon, herring, mussels, trout, sardines, and pollock. Make sure that all meats are cooked to food-safe temperatures. Dairy Pasteurized milk and milk alternatives. Pasteurized yogurt and pasteurized cheese. Cottage cheese. Sour cream. Beverages Water. Juices that contain 100% fruit juice or vegetable juice. Caffeine-free teas and decaffeinated coffee. Drinks that contain caffeine are okay to drink, but it is better to avoid caffeine. Keep your total caffeine intake to less than 200 mg each day (12 oz of coffee, tea, or soda) or as directed by your health care provider. Condiments Any pasteurized condiments. Sweets and Desserts Any sweets and desserts. Fats and Oils Any fats and oils. The items listed above may not be a complete list of recommended foods or beverages. Contact your dietitian for more options. What foods are not recommended? Vegetables Unpasteurized (raw) vegetable juices. Fruits Unpasteurized (raw) fruit juices. Meats and Other Protein Sources Cured meats that have nitrates, such as bacon, salami, and hotdogs. Luncheon meats, bologna, or other deli meats (unless they are reheated until they are steaming hot). Refrigerated pate, meat spreads from a meat counter, smoked seafood that is found in the refrigerated section of a store. Raw fish, such as sushi or sashimi. High mercury content fish, such as tilefish, shark, swordfish, and king mackerel. Raw meats, such as tuna or beef tartare. Undercooked meats and poultry. Make  sure that all meats are cooked to food-safe temperatures. Dairy Unpasteurized (raw) milk and any foods that have raw milk in them. Soft cheeses, such as feta, queso blanco, queso fresco, Brie, Camembert cheeses, blue-veined cheeses, and Panela cheese (unless it is made with pasteurized milk, which must be stated on the label). Beverages Alcohol. Sugar-sweetened beverages, such as sodas, teas, or energy drinks. Condiments Homemade fermented foods and drinks, such as pickles, sauerkraut, or kombucha drinks. (Store-bought pasteurized versions of these are okay.) Other Salads that are made in the store, such as ham salad, chicken salad, egg salad, tuna salad, and seafood salad. The items listed above may not be a complete list of foods and beverages to avoid. Contact your dietitian for more information. This information is not intended to replace advice given to you by your health care provider. Make sure you discuss any questions you have with your health care provider. Document Released: 07/14/2014 Document Revised: 03/06/2016 Document Reviewed: 03/14/2014 Elsevier Interactive Patient Education  [employer].

## 2017-08-13 ENCOUNTER — Ambulatory Visit (INDEPENDENT_AMBULATORY_CARE_PROVIDER_SITE_OTHER): Payer: 59 | Admitting: Obstetrics & Gynecology

## 2017-08-13 ENCOUNTER — Ambulatory Visit (INDEPENDENT_AMBULATORY_CARE_PROVIDER_SITE_OTHER): Payer: 59

## 2017-08-13 ENCOUNTER — Other Ambulatory Visit: Payer: Self-pay | Admitting: Obstetrics & Gynecology

## 2017-08-13 DIAGNOSIS — Z3201 Encounter for pregnancy test, result positive: Secondary | ICD-10-CM

## 2017-08-13 DIAGNOSIS — Z3490 Encounter for supervision of normal pregnancy, unspecified, unspecified trimester: Secondary | ICD-10-CM

## 2017-08-13 DIAGNOSIS — N912 Amenorrhea, unspecified: Secondary | ICD-10-CM

## 2017-08-13 DIAGNOSIS — Z3491 Encounter for supervision of normal pregnancy, unspecified, first trimester: Secondary | ICD-10-CM

## 2017-08-13 DIAGNOSIS — Z3200 Encounter for pregnancy test, result unknown: Secondary | ICD-10-CM

## 2017-08-13 NOTE — Patient Instructions (Signed)
1. Pregnancy at early stage Normal sIUP at 7 5/7 wks, dating corresponds.  FHR 160/min.  On PNVs.  No risk factor except 36 yo at EDD.  Refer to [employer].  Would like Midwifery practice with Artelia Laroche.  Lolita Patella, all my congratulations!

## 2017-08-13 NOTE — Progress Notes (Signed)
    Grace Harvey 1981-04-14 [id number]        36 y.o.  G2P1L1 Married  RP:  Pelvic US 7+ wks viability/dating  HPI:  LMP 06/20/2017.  Normal regular menses.  UPT pos last visit.  No pelvic pain.  No vaginal bleeding.  Very mild nausea, rare vomiting.  On PNVs.  Previous pregnancy FT SVD.  Daughter was 6+ Lbs.  No Cx.  Delivered at [facility] because lived in that area at that time.  Past medical history,surgical history, problem list, medications, allergies, family history and social history were all reviewed and documented in the EPIC chart.  Directed ROS with pertinent positives and negatives documented in the history of present illness/assessment and plan.  Exam:  There were no vitals filed for this visit. General appearance:  Normal  T/V Ob US today:  Retroverted Uterus, long closed cervix.  Normal early single intra-uterine gestation, CRL 7 4/7 wks, dating corresponds with LMP dating at 78 5/7 wks.  YS normal shape.  FHR 169/min.  Right ovary Corpus Luteum Cyst 2.5 x 2.6 cm.  Left Ovary normal.  No FF in CDS.     Assessment/Plan:  36 y.o. G3P0011   1. Pregnancy at early stage Normal sIUP at 7 5/7 wks, dating corresponds.  FHR 160/min.  On PNVs.  No risk factor except 36 yo at EDD.  Refer to [employer].  Would like Midwifery practice with Grace Harvey.  Princess Bruins MD, 3:31 PM 08/13/2017

## 2017-09-08 LAB — OB RESULTS CONSOLE GC/CHLAMYDIA
Chlamydia: NEGATIVE
Gonorrhea: NEGATIVE

## 2017-09-08 LAB — OB RESULTS CONSOLE ABO/RH: RH TYPE: POSITIVE

## 2017-09-08 LAB — OB RESULTS CONSOLE RUBELLA ANTIBODY, IGM: Rubella: IMMUNE

## 2017-09-08 LAB — OB RESULTS CONSOLE HIV ANTIBODY (ROUTINE TESTING): HIV: NONREACTIVE

## 2017-09-08 LAB — OB RESULTS CONSOLE HEPATITIS B SURFACE ANTIGEN: HEP B S AG: NEGATIVE

## 2017-09-08 LAB — OB RESULTS CONSOLE RPR: RPR: NONREACTIVE

## 2017-09-08 LAB — OB RESULTS CONSOLE ANTIBODY SCREEN: Antibody Screen: NEGATIVE

## 2017-10-13 NOTE — L&D Delivery Note (Signed)
Delivery Note At 3:22 AM a viable female was delivered via Vaginal, Spontaneous (Presentation: OA to ROT ).  APGAR: 9, 9; weight  pending.   Placenta status: S/C/I, Grace Harvey .  Placenta to parents per request. Cord: 3VC with the following complications: none.  Cord blood collected for typing.  Anesthesia:  Epidural, local lido Episiotomy: None Lacerations: 2nd degree Suture Repair: 3.0 vicryl rapide Est. Blood Loss (mL): 350  Mom to postpartum.  Alice Reichert to Couplet care / Skin to Skin.  Juliene Pina, CNM 03/27/2018, 3:45 AM

## 2018-02-26 LAB — OB RESULTS CONSOLE GBS: GBS: NEGATIVE

## 2018-03-27 ENCOUNTER — Inpatient Hospital Stay (HOSPITAL_COMMUNITY)
Admission: AD | Admit: 2018-03-27 | Discharge: 2018-03-28 | DRG: 807 | Disposition: A | Discharge status: Home / Self Care | Payer: 59

## 2018-03-27 ENCOUNTER — Encounter (HOSPITAL_COMMUNITY): Payer: Self-pay

## 2018-03-27 ENCOUNTER — Inpatient Hospital Stay (HOSPITAL_COMMUNITY): Payer: 59 | Admitting: Anesthesiology

## 2018-03-27 DIAGNOSIS — Z3A40 40 weeks gestation of pregnancy: Secondary | ICD-10-CM

## 2018-03-27 DIAGNOSIS — Z3483 Encounter for supervision of other normal pregnancy, third trimester: Secondary | ICD-10-CM | POA: Diagnosis present

## 2018-03-27 LAB — CBC
HCT: 37.1 % (ref 36.0–46.0)
HCT: 35.7 % — ABNORMAL LOW (ref 36.0–46.0)
Hemoglobin: 12.3 g/dL (ref 12.0–15.0)
Hemoglobin: 12.1 g/dL (ref 12.0–15.0)
MCH: 30 pg (ref 26.0–34.0)
MCH: 30.5 pg (ref 26.0–34.0)
MCHC: 33.2 g/dL (ref 30.0–36.0)
MCHC: 33.9 g/dL (ref 30.0–36.0)
MCV: 90.5 fL (ref 78.0–100.0)
MCV: 89.9 fL (ref 78.0–100.0)
PLATELETS: 246 10*3/uL (ref 150–400)
PLATELETS: 255 10*3/uL (ref 150–400)
RBC: 4.1 MIL/uL (ref 3.87–5.11)
RBC: 3.97 MIL/uL (ref 3.87–5.11)
RDW: 14 % (ref 11.5–15.5)
RDW: 13.6 % (ref 11.5–15.5)
WBC: 15.4 10*3/uL — ABNORMAL HIGH (ref 4.0–10.5)
WBC: 18.2 10*3/uL — ABNORMAL HIGH (ref 4.0–10.5)

## 2018-03-27 LAB — TYPE AND SCREEN
ABO/RH(D): A POS
ANTIBODY SCREEN: NEGATIVE

## 2018-03-27 LAB — ABO/RH: ABO/RH(D): A POS

## 2018-03-27 LAB — RPR: RPR: NONREACTIVE

## 2018-03-27 MED ORDER — OXYCODONE HCL 5 MG PO TABS: 5.0000 mg | ORAL_TABLET | ORAL | Status: DC | PRN

## 2018-03-27 MED ORDER — EPHEDRINE 5 MG/ML INJ: 10.0000 mg | INTRAVENOUS | Status: DC | PRN

## 2018-03-27 MED ORDER — IBUPROFEN 600 MG PO TABS
600.0000 mg | ORAL_TABLET | Freq: Four times a day (QID) | ORAL | Status: DC
  Administered 2018-03-27 – 2018-03-28 (×6): 600 mg via ORAL
  Filled 2018-03-27 (×6): qty 1

## 2018-03-27 MED ORDER — FLEET ENEMA 7-19 GM/118ML RE ENEM: 1.0000 | ENEMA | Freq: Every day | RECTAL | Status: DC | PRN

## 2018-03-27 MED ORDER — DOCUSATE SODIUM 100 MG PO CAPS
100.0000 mg | ORAL_CAPSULE | Freq: Two times a day (BID) | ORAL | Status: DC
  Administered 2018-03-27 – 2018-03-28 (×2): 100 mg via ORAL
  Filled 2018-03-27 (×2): qty 1

## 2018-03-27 MED ORDER — OXYTOCIN 10 UNIT/ML IJ SOLN
INTRAMUSCULAR | Status: AC
  Filled 2018-03-27: qty 1

## 2018-03-27 MED ORDER — LACTATED RINGERS IV SOLN
INTRAVENOUS | Status: DC
  Administered 2018-03-27: 02:00:00 via INTRAVENOUS

## 2018-03-27 MED ORDER — BENZOCAINE-MENTHOL 20-0.5 % EX AERO
1.0000 "application " | INHALATION_SPRAY | CUTANEOUS | Status: DC | PRN
  Administered 2018-03-27: 1 via TOPICAL
  Filled 2018-03-27 (×2): qty 56

## 2018-03-27 MED ORDER — COCONUT OIL OIL
1.0000 "application " | TOPICAL_OIL | Status: DC | PRN
  Filled 2018-03-27: qty 120

## 2018-03-27 MED ORDER — SIMETHICONE 80 MG PO CHEW: 80.0000 mg | CHEWABLE_TABLET | ORAL | Status: DC | PRN

## 2018-03-27 MED ORDER — LACTATED RINGERS IV SOLN: 500.0000 mL | INTRAVENOUS | Status: DC | PRN

## 2018-03-27 MED ORDER — FENTANYL 2.5 MCG/ML BUPIVACAINE 1/10 % EPIDURAL INFUSION (WH - ANES)
14.0000 mL/h | INTRAMUSCULAR | Status: DC | PRN
  Administered 2018-03-27: 14 mL/h via EPIDURAL
  Filled 2018-03-27: qty 100

## 2018-03-27 MED ORDER — DIPHENHYDRAMINE HCL 25 MG PO CAPS: 25.0000 mg | ORAL_CAPSULE | Freq: Four times a day (QID) | ORAL | Status: DC | PRN

## 2018-03-27 MED ORDER — PHENYLEPHRINE 40 MCG/ML (10ML) SYRINGE FOR IV PUSH (FOR BLOOD PRESSURE SUPPORT): 80.0000 ug | PREFILLED_SYRINGE | INTRAVENOUS | Status: DC | PRN

## 2018-03-27 MED ORDER — OXYTOCIN BOLUS FROM INFUSION
500.0000 mL | Freq: Once | INTRAVENOUS | Status: AC
  Administered 2018-03-27: 500 mL via INTRAVENOUS

## 2018-03-27 MED ORDER — LIDOCAINE HCL (PF) 1 % IJ SOLN
INTRAMUSCULAR | Status: DC | PRN
  Administered 2018-03-27 (×2): 5 mL via EPIDURAL

## 2018-03-27 MED ORDER — DIBUCAINE 1 % RE OINT
1.0000 "application " | TOPICAL_OINTMENT | RECTAL | Status: DC | PRN
  Filled 2018-03-27: qty 28

## 2018-03-27 MED ORDER — ACETAMINOPHEN 325 MG PO TABS: 650.0000 mg | ORAL_TABLET | ORAL | Status: DC | PRN

## 2018-03-27 MED ORDER — PRENATAL MULTIVITAMIN CH
1.0000 | ORAL_TABLET | Freq: Every day | ORAL | Status: DC
  Administered 2018-03-27 – 2018-03-28 (×2): 1 via ORAL
  Filled 2018-03-27 (×2): qty 1

## 2018-03-27 MED ORDER — TETANUS-DIPHTH-ACELL PERTUSSIS 5-2.5-18.5 LF-MCG/0.5 IM SUSP: 0.5000 mL | Freq: Once | INTRAMUSCULAR | Status: DC

## 2018-03-27 MED ORDER — OXYTOCIN 40 UNITS IN LACTATED RINGERS INFUSION - SIMPLE MED
INTRAVENOUS | Status: AC
  Filled 2018-03-27: qty 1000

## 2018-03-27 MED ORDER — LACTATED RINGERS IV SOLN
500.0000 mL | Freq: Once | INTRAVENOUS | Status: AC
  Administered 2018-03-27: 500 mL via INTRAVENOUS

## 2018-03-27 MED ORDER — ONDANSETRON HCL 4 MG/2ML IJ SOLN: 4.0000 mg | INTRAMUSCULAR | Status: DC | PRN

## 2018-03-27 MED ORDER — ONDANSETRON HCL 4 MG/2ML IJ SOLN: 4.0000 mg | Freq: Four times a day (QID) | INTRAMUSCULAR | Status: DC | PRN

## 2018-03-27 MED ORDER — SOD CITRATE-CITRIC ACID 500-334 MG/5ML PO SOLN: 30.0000 mL | ORAL | Status: DC | PRN

## 2018-03-27 MED ORDER — ONDANSETRON HCL 4 MG PO TABS: 4.0000 mg | ORAL_TABLET | ORAL | Status: DC | PRN

## 2018-03-27 MED ORDER — BISACODYL 10 MG RE SUPP
10.0000 mg | Freq: Every day | RECTAL | Status: DC | PRN
  Filled 2018-03-27: qty 1

## 2018-03-27 MED ORDER — PHENYLEPHRINE 40 MCG/ML (10ML) SYRINGE FOR IV PUSH (FOR BLOOD PRESSURE SUPPORT)
80.0000 ug | PREFILLED_SYRINGE | INTRAVENOUS | Status: DC | PRN
  Filled 2018-03-27: qty 10

## 2018-03-27 MED ORDER — DIPHENHYDRAMINE HCL 50 MG/ML IJ SOLN: 12.5000 mg | INTRAMUSCULAR | Status: DC | PRN

## 2018-03-27 MED ORDER — LIDOCAINE HCL (PF) 1 % IJ SOLN
30.0000 mL | INTRAMUSCULAR | Status: DC | PRN
  Administered 2018-03-27: 30 mL via SUBCUTANEOUS
  Filled 2018-03-27: qty 30

## 2018-03-27 MED ORDER — OXYTOCIN 40 UNITS IN LACTATED RINGERS INFUSION - SIMPLE MED: 2.5000 [IU]/h | INTRAVENOUS | Status: DC

## 2018-03-27 MED ORDER — WITCH HAZEL-GLYCERIN EX PADS: 1.0000 "application " | MEDICATED_PAD | CUTANEOUS | Status: DC | PRN

## 2018-03-27 MED ORDER — LACTATED RINGERS IV SOLN: 500.0000 mL | Freq: Once | INTRAVENOUS | Status: DC

## 2018-03-27 NOTE — Lactation Note (Signed)
Lactation Consultation Note  Patient Name: Grace Harvey [id number] Date: 03/27/2018 Reason for consult: Initial assessment;Term Mom reports baby fed well in Labor and Delivery but sleepy since.  Baby is currently sleeping in crib.  Instructed mom to watch for feeding cues and offer breast.  Mom breastfed her first baby for 2 1/2 years.  Encouraged to call out for assist/concerns.  Breastfeeding consultation services and support information given and reviewed.  Maternal Data Has patient been taught Hand Expression?: Yes Does the patient have breastfeeding experience prior to this delivery?: Yes  Feeding    LATCH Score                   Interventions    Lactation Tools Discussed/Used     Consult Status Consult Status: Follow-up Date: 03/28/18 Follow-up type: In-patient    Ave Filter 03/27/2018, 11:08 AM

## 2018-03-27 NOTE — H&P (Signed)
OB ADMISSION/ HISTORY & PHYSICAL:  Admission Date: 03/27/2018  1:16 AM  Admit Diagnosis: Term pregnancy in active labor  Grace Harvey is a 37 y.o. female presenting for labor contractions, had membrane sweep in office today, was 4/90/01. Intense ctx since 0100, no LOF/VB. Desires epidural in active labor.   Prenatal History: G3P1011   EDC : 03/27/2018.  Prenatal care at [facility] since [redacted] weeks gestation  Prenatal course uncomplicated.  Prenatal Labs: ABO, Rh: A (11/27 0000)  Antibody: Negative (11/27 0000) Rubella: Immune (11/27 0000)  RPR: Nonreactive (11/27 0000)  HBsAg: Negative (11/27 0000)  HIV: Non-reactive (11/27 0000)  GBS: Negative (05/17 0000)  1 hr Glucola : 55 Genetics declined CUS normal female anatomy, posterior placenta   Medical / Surgical History :  Past medical history:  Past Medical History:  Diagnosis Date  . Cervical intraepithelial neoplasm HIGH GRADE     Past surgical history:  Past Surgical History:  Procedure Laterality Date  . IUD REMOVAL  03-02-2012   MIRENA  . laser vaporization of cervix  2013  . RESET FX RIGHT ARM  AGE 37  . WISDOM TOOTH EXTRACTION  03-04-2012   [facility]     Family History:  Family History  Problem Relation Age of Onset  . Breast cancer Maternal Aunt        age 59  . Hypertension Father      Social History:  reports that she has never smoked. She has never used smokeless tobacco. She reports that she does not drink alcohol or use drugs.   Allergies: Patient has no known allergies.    Current Medications at time of admission:  Medications Prior to Admission  Medication Sig Dispense Refill Last Dose  . Prenatal Vit-Fe Fumarate-FA (PRENATAL MULTIVITAMIN) TABS tablet Take 1 tablet by mouth daily at 12 noon.   Taking      Review of Systems: ROS  As noted above   Physical Exam:  Dilation: 6.5 Effacement (%): 90 Station: -1 Exam by:: Sigmon, CNM   Blood pressure  121/74, pulse (!) 102, temperature 98.4 F (36.9 C), temperature source Oral, resp. rate 18, height 5\' 4"  (1.626 m), weight 95.5 kg (210 lb 8 oz), last menstrual period 06/20/2017, planning to breastfeed.  General: AAO x 3, NAD, breathing w/ ctx Heart:RRR Lungs: CTAB Abdomen: gravid, NT, EFW 7.5-8 lbs Extremities: trace edema Genitalia / VE: no lesions, BBOW FHR: 130, mod var, + accels, no decels TOCO: q 2-3 min, mod to palp  Labs:    T&S, CBC, RPR pending    Assessment:  37 y.o. G3P0011 at [redacted]w[redacted]d  1. 1st stage of labor, active 2. FHR category 1 3. GBS negative 4. Desires epidural 5. Breastfeeding planned   Plan:  1. Admit to BS 2. Routine L&D orders 3. Analgesia/anesthesia PRN  4. Expectant management 5. Anticipate NSVB   Dr Pamala Hurry notified of admission / plan of care   [place], MSN 03/27/2018, 1:55 AM

## 2018-03-27 NOTE — Anesthesia Postprocedure Evaluation (Signed)
Anesthesia Post Note  Patient: Grace Harvey  Procedure(s) Performed: AN AD [place]     Patient location during evaluation: Mother Baby Anesthesia Type: Epidural Level of consciousness: awake and alert, oriented and patient cooperative Pain management: pain level controlled Vital Signs Assessment: post-procedure vital signs reviewed and stable Respiratory status: spontaneous breathing Cardiovascular status: stable Postop Assessment: no headache, epidural receding, patient able to bend at knees and no signs of nausea or vomiting Anesthetic complications: no Comments: Pain score 0.    Last Vitals:  Vitals:   03/27/18 0520 03/27/18 0632  BP: (!) 129/58 126/71  Pulse: 80 78  Resp: 18 18  Temp: 37 C 36.6 C  SpO2: 99%     Last Pain:  Vitals:   03/27/18 0632  TempSrc: Oral  PainSc:    Pain Goal:                 [facility]

## 2018-03-27 NOTE — Anesthesia Preprocedure Evaluation (Addendum)
Anesthesia Evaluation  Patient identified by MRN, date of birth, ID band Patient awake    Reviewed: Allergy & Precautions, NPO status , Patient's Chart, lab work & pertinent test results  History of Anesthesia Complications Negative for: history of anesthetic complications  Airway Mallampati: II  TM Distance: >3 FB Neck ROM: Full    Dental  (+) Dental Advisory Given   Pulmonary    breath sounds clear to auscultation       Cardiovascular negative cardio ROS   Rhythm:Regular Rate:Normal     Neuro/Psych negative neurological ROS     GI/Hepatic Neg liver ROS, GERD  ,  Endo/Other  negative endocrine ROS  Renal/GU negative Renal ROS     Musculoskeletal   Abdominal (+) + obese,   Peds  Hematology plt 246k   Anesthesia Other Findings   Reproductive/Obstetrics (+) Pregnancy                            Anesthesia Physical Anesthesia Plan  ASA: II  Anesthesia Plan: Epidural   Post-op Pain Management:    Induction:   PONV Risk Score and Plan: 2 and Treatment may vary due to age or medical condition  Airway Management Planned: Natural Airway  Additional Equipment:   Intra-op Plan:   Post-operative Plan:   Informed Consent: I have reviewed the patients History and Physical, chart, labs and discussed the procedure including the risks, benefits and alternatives for the proposed anesthesia with the patient or authorized representative who has indicated his/her understanding and acceptance.   Dental advisory given  Plan Discussed with:   Anesthesia Plan Comments: (Patient identified. Risks/Benefits/Options discussed with patient including but not limited to bleeding, infection, nerve damage, paralysis, failed block, incomplete pain control, headache, blood pressure changes, nausea, vomiting, reactions to medication both or allergic, itching and postpartum back pain. Confirmed with bedside  nurse the patient's most recent platelet count. Confirmed with patient that they are not currently taking any anticoagulation, have any bleeding history or any family history of bleeding disorders. Patient expressed understanding and wished to proceed. All questions were answered. )       Anesthesia Quick Evaluation

## 2018-03-27 NOTE — Anesthesia Procedure Notes (Signed)
Epidural Patient location during procedure: OB Start time: 03/27/2018 2:15 AM End time: 03/27/2018 2:33 AM  Staffing Anesthesiologist: Annye Asa, MD Performed: anesthesiologist   Preanesthetic Checklist Completed: patient identified, surgical consent, pre-op evaluation, timeout performed, IV checked, risks and benefits discussed and monitors and equipment checked  Epidural Patient position: sitting Prep: site prepped and draped and DuraPrep Patient monitoring: blood pressure, continuous pulse ox and heart rate Approach: midline Location: L3-L4 Injection technique: LOR air  Needle:  Needle type: Tuohy  Needle gauge: 17 G Needle length: 9 cm Needle insertion depth: 4.5 cm Catheter type: closed end flexible Catheter size: 19 Gauge Catheter at skin depth: 10 cm Test dose: negative (1% lidocaine)  Assessment Events: blood not aspirated, injection not painful, no injection resistance, negative IV test and no paresthesia  Additional Notes Pt identified in Labor room.  Monitors applied. Working IV access confirmed. Sterile prep, drape lumbar spine.  1% lido local L 3,4.  #17ga Touhy LOR air at 4.5 cm L 3,4, cath in easily to 10 cm skin. Test dose OK, cath dosed and infusion begun.  Patient asymptomatic, VSS, no heme aspirated, tolerated well.  Jenita Seashore, MDReason for block:procedure for pain

## 2018-03-28 MED ORDER — DOCUSATE SODIUM 100 MG PO CAPS: 100.0000 mg | ORAL_CAPSULE | Freq: Two times a day (BID) | ORAL | 0 refills | Status: DC

## 2018-03-28 MED ORDER — COCONUT OIL OIL: 1.0000 "application " | TOPICAL_OIL | 0 refills | Status: DC | PRN

## 2018-03-28 MED ORDER — BENZOCAINE-MENTHOL 20-0.5 % EX AERO: 1.0000 "application " | INHALATION_SPRAY | CUTANEOUS | Status: DC | PRN

## 2018-03-28 MED ORDER — ACETAMINOPHEN 325 MG PO TABS: 650.0000 mg | ORAL_TABLET | ORAL | Status: DC | PRN

## 2018-03-28 MED ORDER — IBUPROFEN 600 MG PO TABS: 600.0000 mg | ORAL_TABLET | Freq: Four times a day (QID) | ORAL | 0 refills | Status: DC

## 2018-03-28 NOTE — Lactation Note (Signed)
This note was copied from a baby's chart. Lactation Consultation Note  Patient Name: Girl Kassandra Meriweather [id number] Date: 03/28/2018 Reason for consult: Follow-up assessment Observed baby at breast with active suck/swallows.  Instructed to use breast massage during feeding.  Discussed milk coming to volume and treatment of engorgement.  Lactation outpatient services and support information reviewed and encouraged prn.  Maternal Data    Feeding Feeding Type: Breast Fed Length of feed: 10 min  LATCH Score Latch: Grasps breast easily, tongue down, lips flanged, rhythmical sucking.  Audible Swallowing: A few with stimulation  Type of Nipple: Everted at rest and after stimulation  Comfort (Breast/Nipple): Soft / non-tender  Hold (Positioning): No assistance needed to correctly position infant at breast.  LATCH Score: 9  Interventions    Lactation Tools Discussed/Used     Consult Status Consult Status: Complete    Ave Filter 03/28/2018, 10:09 AM

## 2018-03-28 NOTE — Progress Notes (Signed)
PPD # 1 SVD Information for the patient's newborn:  Jamiya, Nims [[id number]]  female      breast feeding  Baby name: Juluis Mire:  Reports feeling well, desires DC home             Tolerating po/ No nausea or vomiting             Bleeding is light             Pain controlled with ibuprofen (OTC)             Up ad lib / ambulatory / voiding without difficulties        O:  A & O x 3, in no apparent distress              VS:  Vitals:   03/27/18 1430 03/27/18 1828 03/27/18 2259 03/28/18 0601  BP: (!) 110/57 119/67 (!) 104/56 (!) 98/59  Pulse: 82 80 70 63  Resp: 18 18 18 18   Temp: 98.6 F (37 C) 98.6 F (37 C) 98.1 F (36.7 C) 98 F (36.7 C)  TempSrc: Axillary Axillary Oral Oral  SpO2:  97% 96%   Weight:      Height:        LABS:  Recent Labs    03/27/18 0145 03/27/18 0632  WBC 15.4* 18.2*  HGB 12.3 12.1  HCT 37.1 35.7*  PLT 246 255    Blood type: --/--/A POS, A POS Performed at [facility], [address], [place], [place] [address]  (06/15 0145)  Rubella: Immune (11/27 0000)   I&O: I/O last 3 completed shifts: In: -  Out: 650 [Urine:300; Blood:350]          No intake/output data recorded.  Lungs: Clear and unlabored  Heart: regular rate and rhythm / no murmurs  Abdomen: soft, non-tender, non-distended             Fundus: firm, non-tender, U-1  Perineum: repair intact, no edema  Lochia: small  Extremities: trace edema, no calf pain or tenderness    A/P: PPD # 1 37 y.o., [id number]   Principal Problem:   SVD 6/15 Active Problems:   Postpartum care following vaginal delivery   Second-degree perineal laceration, with delivery   Doing well - stable status  Routine post partum orders             DC home today w/ instructions  F/U at [facility] in 6 weeks and PRN     Juliene Pina, MSN, CNM 03/28/2018, 9:18 AM

## 2018-03-28 NOTE — Discharge Summary (Signed)
Obstetric Discharge Summary Reason for Admission: onset of labor Prenatal Procedures: ultrasound Intrapartum Procedures: spontaneous vaginal delivery and epidural Postpartum Procedures: none Complications-Operative and Postpartum: 2nd degree perineal laceration Hemoglobin  Date Value Ref Range Status  03/27/2018 12.1 12.0 - 15.0 g/dL Final   HCT  Date Value Ref Range Status  03/27/2018 35.7 (L) 36.0 - 46.0 % Final    Physical Exam:  General: alert, cooperative and no distress Lochia: appropriate Uterine Fundus: firm Incision: healing well DVT Evaluation: No significant calf/ankle edema.  Discharge Diagnoses:  Patient Active Problem List   Diagnosis Date Noted  . SVD 6/15 03/27/2018  . Postpartum care following vaginal delivery 03/27/2018  . Second-degree perineal laceration, with delivery 03/27/2018    Discharge Information: Date: 03/28/2018 Activity: pelvic rest Diet: routine Medications:  Allergies as of 03/28/2018   No Known Allergies     Medication List    TAKE these medications   acetaminophen 325 MG tablet Commonly known as:  TYLENOL Take 2 tablets (650 mg total) by mouth every 4 (four) hours as needed (for pain scale < 4ORtemperature>/=100.5 F).   benzocaine-Menthol 20-0.5 % Aero Commonly known as:  DERMOPLAST Apply 1 application topically as needed for irritation (perineal discomfort).   coconut oil Oil Apply 1 application topically as needed.   docusate sodium 100 MG capsule Commonly known as:  COLACE Take 1 capsule (100 mg total) by mouth 2 (two) times daily.   EQL EVENING PRIMROSE OIL 500 MG Caps Take 1 capsule by mouth daily.   ibuprofen 600 MG tablet Commonly known as:  ADVIL,MOTRIN Take 1 tablet (600 mg total) by mouth every 6 (six) hours.   prenatal multivitamin Tabs tablet Take 1 tablet by mouth daily at 12 noon.   ranitidine 150 MG tablet Commonly known as:  ZANTAC Take 150 mg by mouth 2 (two) times daily.             Discharge Care Instructions  (From admission, onward)        Start     Ordered   03/28/18 0000  Discharge wound care:    Comments:  Sitz baths 2 times /day with warm water x 1 week   03/28/18 2025    Condition: stable Instructions: refer to practice specific booklet Discharge to: home Follow-up [place], [place]ney, CNM. Schedule an appointment as soon as possible for a visit in 6 week(s).   Specialty:  Obstetrics and Gynecology Contact information: [place] [address] [phone]           Newborn Data: Live born female [name] Birth Weight: 8 lb 4.3 oz (3751 g) APGAR: 83, 9  Newborn Delivery   Birth date/time:  03/27/2018 03:22:00 Delivery type:  Vaginal, Spontaneous     Home with mother.  Juliene Pina, CNM 03/28/2018, 9:38 AM

## 2019-08-25 ENCOUNTER — Telehealth: Payer: Self-pay | Admitting: *Deleted

## 2019-08-25 NOTE — Telephone Encounter (Signed)
Pt left message wanting IUD insertion.  Delivered 03/2018.  Schedule CE here 11/2019.  Wants IUD before then.  Left message for her to call me back.  KW CMA

## 2019-08-29 ENCOUNTER — Other Ambulatory Visit: Payer: Self-pay

## 2019-08-29 DIAGNOSIS — Z20822 Contact with and (suspected) exposure to covid-19: Secondary | ICD-10-CM

## 2019-08-31 LAB — NOVEL CORONAVIRUS, NAA: SARS-CoV-2, NAA: NOT DETECTED

## 2019-11-22 ENCOUNTER — Encounter: Payer: 59 | Admitting: Obstetrics & Gynecology

## 2019-11-30 ENCOUNTER — Other Ambulatory Visit: Payer: Self-pay

## 2019-12-01 ENCOUNTER — Encounter: Payer: 59 | Admitting: Obstetrics & Gynecology

## 2019-12-02 ENCOUNTER — Other Ambulatory Visit: Payer: Self-pay

## 2019-12-05 ENCOUNTER — Encounter: Payer: 59 | Admitting: Obstetrics & Gynecology

## 2019-12-05 ENCOUNTER — Other Ambulatory Visit: Payer: Self-pay

## 2019-12-15 ENCOUNTER — Other Ambulatory Visit: Payer: Self-pay

## 2019-12-16 ENCOUNTER — Encounter: Payer: Self-pay | Admitting: Obstetrics & Gynecology

## 2019-12-16 ENCOUNTER — Ambulatory Visit: Payer: BC Managed Care – PPO | Admitting: Obstetrics & Gynecology

## 2019-12-16 VITALS — BP 134/86 | Ht 65.0 in | Wt 180.0 lb

## 2019-12-16 DIAGNOSIS — Z01419 Encounter for gynecological examination (general) (routine) without abnormal findings: Secondary | ICD-10-CM

## 2019-12-16 DIAGNOSIS — Z3009 Encounter for other general counseling and advice on contraception: Secondary | ICD-10-CM

## 2019-12-16 DIAGNOSIS — R87610 Atypical squamous cells of undetermined significance on cytologic smear of cervix (ASC-US): Secondary | ICD-10-CM | POA: Diagnosis not present

## 2019-12-16 NOTE — Progress Notes (Signed)
Grace Harvey [birth year] [id number]   History:    39 y.o. G3P2A1L2 Married.  Grace Harvey almost 39 yo, and Grace Harvey will be 39 yo in June.  RP:  Established patient presenting for annual gyn exam   HPI:  Stopped Breastfeeding in 09/2019.  Menses regular normal every month.  No pelvic pain.  Normal vaginal secretions.  No pain with IC.   Mictions/BMs wnl.  Breasts normal.  Still a little bit of breast milk if presses the nipples. Good nutrition, physically active.  BMI 29.95.   Past medical history,surgical history, family history and social history were all reviewed and documented in the EPIC chart.  Gynecologic History Patient's last menstrual period was 11/23/2019.  Obstetric History OB History  Gravida Para Term Preterm AB Living  3 2 1   1 2   SAB TAB Ectopic Multiple Live Births        0 1    # Outcome Date GA Lbr Grace/2nd Weight Sex Delivery Anes PTL Lv  3 Term 03/27/18 [redacted]w[redacted]d 01:45 / 00:09 8 lb 4.3 oz (3.751 kg) F Vag-Spont EPI  LIV  2 AB           1 Para     F Vag-Spont        ROS: A ROS was performed and pertinent positives and negatives are included in the history.  GENERAL: No fevers or chills. HEENT: No change in vision, no earache, sore throat or sinus congestion. NECK: No pain or stiffness. CARDIOVASCULAR: No chest pain or pressure. No palpitations. PULMONARY: No shortness of breath, cough or wheeze. GASTROINTESTINAL: No abdominal pain, nausea, vomiting or diarrhea, melena or bright red blood per rectum. GENITOURINARY: No urinary frequency, urgency, hesitancy or dysuria. MUSCULOSKELETAL: No joint or muscle pain, no back pain, no recent trauma. DERMATOLOGIC: No rash, no itching, no lesions. ENDOCRINE: No polyuria, polydipsia, no heat or cold intolerance. No recent change in weight. HEMATOLOGICAL: No anemia or easy bruising or bleeding. NEUROLOGIC: No headache, seizures, numbness, tingling or weakness. PSYCHIATRIC: No depression, no loss of interest in normal activity or change in  sleep pattern.     Exam:   BP 134/86   Ht 5\' 5"  (1.651 m)   Wt 180 lb (81.6 kg)   LMP 11/23/2019   BMI 29.95 kg/m   Body mass index is 29.95 kg/m.  General appearance : Well developed well nourished female. No acute distress HEENT: Eyes: no retinal hemorrhage or exudates,  Neck supple, trachea midline, no carotid bruits, no thyroidmegaly Lungs: Clear to auscultation, no rhonchi or wheezes, or rib retractions  Heart: Regular rate and rhythm, no murmurs or gallops Breast:Examined in sitting and supine position were symmetrical in appearance, no palpable masses or tenderness,  no skin retraction, no nipple inversion, no nipple discharge, no skin discoloration, no axillary or supraclavicular lymphadenopathy Abdomen: no palpable masses or tenderness, no rebound or guarding Extremities: no edema or skin discoloration or tenderness  Pelvic: Vulva: Normal             Vagina: No gross lesions or discharge  Cervix: No gross lesions or discharge.  Pap reflex done.  Uterus  AV, normal size, shape and consistency, non-tender and mobile  Adnexa  Without masses or tenderness  Anus: Normal   Assessment/Plan:  39 y.o. female for annual exam   1. Encounter for routine gynecological examination with Papanicolaou smear of cervix Normal gynecologic exam.  Pap reflex done.  Breast exam normal.  Body mass index 29.95.  Good fitness.  Healthy nutrition.  No particular risk associated with family history.  Will start health labs at age 82.  2. Encounter for other general counseling or advice on contraception Counseling on contraception.  Decision to use Mirena IUD again, has done well on it in the past.  F/U Mirena IUD insertion with menses.  Grace Bruins MD, 2:39 PM 12/16/2019

## 2019-12-16 NOTE — Patient Instructions (Signed)
1. Encounter for routine gynecological examination with Papanicolaou smear of cervix Normal gynecologic exam.  Pap reflex done.  Breast exam normal.  Body mass index 29.95.  Good fitness.  Healthy nutrition.  No particular risk associated with family history.  Will start health labs at age 39.  2. Encounter for other general counseling or advice on contraception Counseling on contraception.  Decision to use Mirena IUD again, has done well on it in the past.  F/U Mirena IUD insertion with menses.  Avamarie, it was a pleasure seeing you today!  I will inform you of your results as soon as they are available.

## 2019-12-16 NOTE — Addendum Note (Signed)
Addended by: Thurnell Garbe A on: 12/16/2019 03:11 PM   Modules accepted: Orders

## 2019-12-21 LAB — PAP IG W/ RFLX HPV ASCU

## 2019-12-21 LAB — HUMAN PAPILLOMAVIRUS, HIGH RISK: HPV DNA High Risk: NOT DETECTED

## 2019-12-22 ENCOUNTER — Other Ambulatory Visit: Payer: Self-pay

## 2019-12-23 ENCOUNTER — Encounter: Payer: Self-pay | Admitting: Obstetrics & Gynecology

## 2019-12-23 ENCOUNTER — Ambulatory Visit (INDEPENDENT_AMBULATORY_CARE_PROVIDER_SITE_OTHER): Payer: BC Managed Care – PPO | Admitting: Obstetrics & Gynecology

## 2019-12-23 VITALS — BP 124/76

## 2019-12-23 DIAGNOSIS — Z30430 Encounter for insertion of intrauterine contraceptive device: Secondary | ICD-10-CM

## 2019-12-23 NOTE — Progress Notes (Signed)
    Grace Harvey 1980/12/06 [id number]        39 y.o.  [id number]   RP: Mirena IUD insertion  HPI: Has done well on Mirena IUD in the past, would like to return on that contraceptive.  Stopped Breastfeeding in 09/2019.  Menses regular normal every month.  No pelvic pain. Normal vaginal secretions.  No pain with IC.  Mictions/BMs wnl. Good nutrition, physically active.  BMI 29.95.    OB History  Gravida Para Term Preterm AB Living  3 2 1   1 2   SAB TAB Ectopic Multiple Live Births        0 1    # Outcome Date GA Lbr Len/2nd Weight Sex Delivery Anes PTL Lv  3 Term 03/27/18 [redacted]w[redacted]d 01:45 / 00:09 8 lb 4.3 oz (3.751 kg) F Vag-Spont EPI  LIV  2 AB           1 Para     F Vag-Spont       Past medical history,surgical history, problem list, medications, allergies, family history and social history were all reviewed and documented in the EPIC chart.   Directed ROS with pertinent positives and negatives documented in the history of present illness/assessment and plan.  Exam:  Vitals:   12/23/19 1127  BP: 124/76   General appearance:  Normal                                                                    IUD procedure note       Patient presented to the office today for placement of Mirena IUD. The patient had previously been provided with literature information on this method of contraception. The risks benefits and pros and cons were discussed and all her questions were answered. She is fully aware that this form of contraception is 99% effective and is good for 5 years.  Pelvic exam: Vulva normal Vagina: No lesions or discharge Cervix: No lesions or discharge Uterus: Intermediate position Adnexa: No masses or tenderness Rectal exam: Not done  The cervix was cleansed with Betadine solution. Hurricane spray on the cervix.  A single-tooth tenaculum was placed on the anterior cervical lip. Os finder used.  The IUD was shown to the patient and inserted in a sterile fashion.   Hysterometry with the IUD as being inserted was 8 cm.  The IUD string was trimmed. The single-tooth tenaculum was removed. Patient was instructed to return back to the office in one month for follow up.        Assessment/Plan:  39 y.o. [id number]   1. Encounter for IUD insertion Easy Mirena IUD insertion.  Well-tolerated by patient.  No complication.  Postprocedure precautions reviewed.  We will follow-up in 4 weeks for IUD check.  Princess Bruins MD, 11:56 AM 12/23/2019

## 2019-12-24 ENCOUNTER — Encounter: Payer: Self-pay | Admitting: Obstetrics & Gynecology

## 2019-12-24 NOTE — Patient Instructions (Signed)
1. Encounter for IUD insertion Easy Mirena IUD insertion.  Well-tolerated by patient.  No complication.  Postprocedure precautions reviewed.  We will follow-up in 4 weeks for IUD check.  Savanah, it was a pleasure seeing you today!

## 2019-12-28 ENCOUNTER — Encounter: Payer: Self-pay | Admitting: Anesthesiology

## 2020-01-19 ENCOUNTER — Ambulatory Visit: Payer: BC Managed Care – PPO | Admitting: Obstetrics & Gynecology

## 2020-01-24 ENCOUNTER — Other Ambulatory Visit: Payer: Self-pay

## 2020-01-24 ENCOUNTER — Encounter: Payer: Self-pay | Admitting: Obstetrics & Gynecology

## 2020-01-24 ENCOUNTER — Ambulatory Visit: Payer: BC Managed Care – PPO | Admitting: Obstetrics & Gynecology

## 2020-01-24 VITALS — BP 128/82

## 2020-01-24 DIAGNOSIS — Z30431 Encounter for routine checking of intrauterine contraceptive device: Secondary | ICD-10-CM | POA: Diagnosis not present

## 2020-01-24 NOTE — Patient Instructions (Signed)
1. IUD check up Well on Mirena IUD since insertion a month ago.  No sign of infection and IUD in good position.  Patient reassured.  Will follow up at next annual gynecologic exam.  Kamani, it was a pleasure seeing you today!

## 2020-01-24 NOTE — Progress Notes (Signed)
    Grace Harvey 01/03/1981 [id number]        39 y.o.  [id number]   RP: Mirena IUD check 1 month post insertion  HPI:  Well on Mirena IUD x insertion.  No abnormal bleeding.  Had a very light menstrual period.  No abnormal vaginal discharge.  No pain with IC.  No pelvic pain.  No fever.   OB History  Gravida Para Term Preterm AB Living  3 2 1   1 2   SAB TAB Ectopic Multiple Live Births        0 1    # Outcome Date GA Lbr Len/2nd Weight Sex Delivery Anes PTL Lv  3 Term 03/27/18 [redacted]w[redacted]d 01:45 / 00:09 8 lb 4.3 oz (3.751 kg) F Vag-Spont EPI  LIV  2 AB           1 Para     F Vag-Spont       Past medical history,surgical history, problem list, medications, allergies, family history and social history were all reviewed and documented in the EPIC chart.   Directed ROS with pertinent positives and negatives documented in the history of present illness/assessment and plan.  Exam:  Vitals:   01/24/20 1603  BP: 128/82   General appearance:  Normal  Abdomen: Normal  Gynecologic exam: Vulva normal.  Speculum: Cervix normal with no erythema.  IUD strings visible at the external os.  Normal vaginal secretions.   Assessment/Plan:  39 y.o. [id number]   1. IUD check up Well on Mirena IUD since insertion a month ago.  No sign of infection and IUD in good position.  Patient reassured.  Will follow up at next annual gynecologic exam.   Princess Bruins MD, 4:09 PM 01/24/2020

## 2020-03-10 ENCOUNTER — Ambulatory Visit (INDEPENDENT_AMBULATORY_CARE_PROVIDER_SITE_OTHER)
Admission: RE | Admit: 2020-03-10 | Discharge: 2020-03-10 | Disposition: A | Discharge status: Home / Self Care | Payer: BC Managed Care – PPO | Source: Ambulatory Visit

## 2020-03-10 DIAGNOSIS — L989 Disorder of the skin and subcutaneous tissue, unspecified: Secondary | ICD-10-CM

## 2020-03-10 MED ORDER — TRIAMCINOLONE ACETONIDE 0.1 % EX CREA: 1.0000 "application " | TOPICAL_CREAM | Freq: Two times a day (BID) | CUTANEOUS | 0 refills | Status: DC

## 2020-03-10 MED ORDER — DOXYCYCLINE HYCLATE 100 MG PO CAPS: 100.0000 mg | ORAL_CAPSULE | Freq: Two times a day (BID) | ORAL | 0 refills | Status: DC

## 2020-03-10 NOTE — ED Provider Notes (Signed)
Virtual Visit via Video Note:  Grace Harvey  initiated request for Telemedicine visit with [facility] team. I connected with Grace Harvey  on 03/10/2020 at 1:09 PM  for a synchronized telemedicine visit using a video enabled HIPPA compliant telemedicine application. I verified that I am speaking with Grace Harvey  using two identifiers. Hallie C Wieters, PA-C  was physically located in a [facility] Urgent care site and Grace Harvey was located at a different location.   The limitations of evaluation and management by telemedicine as well as the availability of in-person appointments were discussed. Patient was informed that she  may incur a bill ( including co-pay) for this virtual visit encounter. Grace Harvey  expressed understanding and gave verbal consent to proceed with virtual visit.     History of Present Illness:Grace Harvey  is a 39 y.o. female presents for evaluation of lesion to right arm.  Patient reports that she noticed an area to her right wrist that was a small scabbing lesion on Sunday/Monday.  She accidentally rubbed this area and the area started peeling and became red and irritated.  She has had associated redness and burning/sensitivity sensation, denies itching.  Reports using a clearish yellow fluid, but no pus.  Area continues to peel.  She denies any known injury or trauma to this area, denies any burns, but reports this area feels like burn.  She is concerned about possible infection as there is some tenderness surrounding the area and appears slightly swollen and red.  Denies fevers.      No Known Allergies   Past Medical History:  Diagnosis Date  . Cervical intraepithelial neoplasm HIGH GRADE     Social History   Tobacco Use  . Smoking status: Never Smoker  . Smokeless tobacco: Never Used  Substance Use Topics  . Alcohol use: No    Alcohol/week: 0.0 standard drinks  . Drug use: No        Observations/Objective: Physical Exam   Constitutional: She is oriented to person, place, and time and well-developed, well-nourished, and in no distress. No distress.  HENT:  Head: Normocephalic and atraumatic.  Pulmonary/Chest: Effort normal. No respiratory distress.  Speaking full sentences  Neurological: She is alert and oriented to person, place, and time.  Speech clear, face symmetric  Skin:  Right wrist with circular area of erythema that appears raised with central peeling and irregularity     Assessment and Plan:  Arm skin lesion, right  Empirically covering for cellulitis/infection with doxycycline.  Provided triamcinolone to help with any local inflammation contributing to symptoms.  Continue to monitor closely for gradual improvement.  If not seeing any improvement or worsening to follow-up in person for better visualization of lesion.  Discussed strict return precautions. Patient verbalized understanding and is agreeable with plan.     Follow Up Instructions:     I discussed the assessment and treatment plan with the patient. The patient was provided an opportunity to ask questions and all were answered. The patient agreed with the plan and demonstrated an understanding of the instructions.   The patient was advised to call back or seek an in-person evaluation if the symptoms worsen or if the condition fails to improve as anticipated.      Grace Lima, PA-C  03/10/2020 1:09 PM         Grace Lima, PA-C 03/10/20 1317

## 2020-03-11 ENCOUNTER — Telehealth (HOSPITAL_COMMUNITY): Payer: Self-pay

## 2020-03-11 MED ORDER — TRIAMCINOLONE ACETONIDE 0.1 % EX CREA: 1.0000 "application " | TOPICAL_CREAM | Freq: Two times a day (BID) | CUTANEOUS | 0 refills | Status: DC

## 2020-03-11 MED ORDER — DOXYCYCLINE HYCLATE 100 MG PO CAPS: 100.0000 mg | ORAL_CAPSULE | Freq: Two times a day (BID) | ORAL | 0 refills | Status: AC

## 2020-03-11 NOTE — Telephone Encounter (Signed)
Patient called requesting medication be sent to different pharmacy.

## 2020-03-27 DIAGNOSIS — Z0000 Encounter for general adult medical examination without abnormal findings: Secondary | ICD-10-CM | POA: Diagnosis not present

## 2020-03-27 DIAGNOSIS — Z13220 Encounter for screening for lipoid disorders: Secondary | ICD-10-CM | POA: Diagnosis not present

## 2020-03-27 DIAGNOSIS — R197 Diarrhea, unspecified: Secondary | ICD-10-CM | POA: Diagnosis not present

## 2020-05-15 DIAGNOSIS — Z20822 Contact with and (suspected) exposure to covid-19: Secondary | ICD-10-CM | POA: Diagnosis not present

## 2020-05-15 DIAGNOSIS — Z03818 Encounter for observation for suspected exposure to other biological agents ruled out: Secondary | ICD-10-CM | POA: Diagnosis not present

## 2020-06-11 DIAGNOSIS — Z20828 Contact with and (suspected) exposure to other viral communicable diseases: Secondary | ICD-10-CM | POA: Diagnosis not present

## 2020-07-04 DIAGNOSIS — R070 Pain in throat: Secondary | ICD-10-CM | POA: Diagnosis not present

## 2022-01-08 ENCOUNTER — Other Ambulatory Visit (HOSPITAL_COMMUNITY): Payer: Self-pay

## 2022-01-08 MED ORDER — SERTRALINE HCL 50 MG PO TABS
ORAL_TABLET | ORAL | 0 refills | Status: DC
  Filled 2022-01-08: qty 135, 90d supply, fill #0

## 2022-01-13 ENCOUNTER — Encounter: Payer: Self-pay | Admitting: Obstetrics & Gynecology

## 2022-01-13 ENCOUNTER — Ambulatory Visit (INDEPENDENT_AMBULATORY_CARE_PROVIDER_SITE_OTHER): Payer: No Typology Code available for payment source | Admitting: Obstetrics & Gynecology

## 2022-01-13 ENCOUNTER — Telehealth: Payer: Self-pay

## 2022-01-13 ENCOUNTER — Other Ambulatory Visit (HOSPITAL_COMMUNITY)
Admission: RE | Admit: 2022-01-13 | Discharge: 2022-01-13 | Disposition: A | Discharge status: Home / Self Care | Payer: No Typology Code available for payment source | Source: Ambulatory Visit | Attending: Obstetrics & Gynecology | Admitting: Obstetrics & Gynecology

## 2022-01-13 VITALS — BP 116/74 | HR 72 | Resp 16 | Ht 65.0 in | Wt 188.0 lb

## 2022-01-13 DIAGNOSIS — N6311 Unspecified lump in the right breast, upper outer quadrant: Secondary | ICD-10-CM | POA: Diagnosis not present

## 2022-01-13 DIAGNOSIS — Z6831 Body mass index (BMI) 31.0-31.9, adult: Secondary | ICD-10-CM

## 2022-01-13 DIAGNOSIS — Z01419 Encounter for gynecological examination (general) (routine) without abnormal findings: Secondary | ICD-10-CM

## 2022-01-13 DIAGNOSIS — E6609 Other obesity due to excess calories: Secondary | ICD-10-CM | POA: Diagnosis not present

## 2022-01-13 DIAGNOSIS — Z30431 Encounter for routine checking of intrauterine contraceptive device: Secondary | ICD-10-CM

## 2022-01-13 NOTE — Progress Notes (Signed)
? ? ?ANE CONERLY 1981/02/04 [id number] ? ? ?History:    41 y.o. . [id number] Married.  [name] 8++ yo, and [name] almost 41 yo.     ?  ?RP:  Established patient presenting for annual gyn exam  ?  ?HPI:  Well on Mirena IUD x 12/23/2019.  No BTB.  No pelvic pain.  Normal vaginal secretions.  No pain with IC.   Pap test 12/16/2019 Neg.  Pap reflex today. Mictions/BMs wnl.  Left Breast normal.  Right breast with a small lump at the upper outer aspect x about 1-2 months, not increasing in size.  Mildly tender.  Good nutrition, physically active.  BMI 31.28.  ? ? ?Past medical history,surgical history, family history and social history were all reviewed and documented in the EPIC chart. ? ?Gynecologic History ?No LMP recorded. (Menstrual status: IUD). ? ?Obstetric History ?OB History  ?Gravida Para Term Preterm AB Living  ?'3 2 1   1 2  '$ ?SAB IAB Ectopic Multiple Live Births  ?      0 1  ?  ?# Outcome Date GA Lbr Len/2nd Weight Sex Delivery Anes PTL Lv  ?3 Term 03/27/18 63w0d01:45 / 00:09 8 lb 4.3 oz (3.751 kg) F Vag-Spont EPI  LIV  ?2 AB           ?1[place]? ?ROS: A ROS was performed and pertinent positives and negatives are included in the history. ? GENERAL: No fevers or chills. HEENT: No change in vision, no earache, sore throat or sinus congestion. NECK: No pain or stiffness. CARDIOVASCULAR: No chest pain or pressure. No palpitations. PULMONARY: No shortness of breath, cough or wheeze. GASTROINTESTINAL: No abdominal pain, nausea, vomiting or diarrhea, melena or bright red blood per rectum. GENITOURINARY: No urinary frequency, urgency, hesitancy or dysuria. MUSCULOSKELETAL: No joint or muscle pain, no back pain, no recent trauma. DERMATOLOGIC: No rash, no itching, no lesions. ENDOCRINE: No polyuria, polydipsia, no heat or cold intolerance. No recent change in weight. HEMATOLOGICAL: No anemia or easy bruising or bleeding. NEUROLOGIC: No headache, seizures, numbness, tingling or weakness. PSYCHIATRIC: No  depression, no loss of interest in normal activity or change in sleep pattern.  ?  ? ?Exam: ? ? ?BP 116/74   Pulse 72   Resp 16   Ht '5\' 5"'$  (1.651 m)   Wt 188 lb (85.3 kg)   BMI 31.28 kg/m?  ? ?Body mass index is 31.28 kg/m?. ? ?General appearance : Well developed well nourished female. No acute distress ?HEENT: Eyes: no retinal hemorrhage or exudates,  Neck supple, trachea midline, no carotid bruits, no thyroidmegaly ?Lungs: Clear to auscultation, no rhonchi or wheezes, or rib retractions  ?Heart: Regular rate and rhythm, no murmurs or gallops ?Breast:Examined in sitting and supine position were symmetrical in appearance, Left no palpable masses or tenderness, Right breast nodule/cyst 1 cm, mobile, mildly tender at 11 O'Clock, no skin retraction, no nipple inversion, no nipple discharge, no skin discoloration, no axillary or supraclavicular lymphadenopathy ?Abdomen: no palpable masses or tenderness, no rebound or guarding ?Extremities: no edema or skin discoloration or tenderness ? ?Pelvic: Vulva: Normal ?            Vagina: No gross lesions or discharge ? Cervix: No gross lesions or discharge.  IUD strings visible at the EO.  Pap reflex done. ? Uterus  AV, normal size, shape and consistency, non-tender and mobile ? Adnexa  Without masses or  tenderness ? Anus: Normal ? ? ?Assessment/Plan:  41 y.o. female for annual exam  ? ?1. Encounter for routine gynecological examination with Papanicolaou smear of cervix ?Well on Mirena IUD x 12/23/2019.  No BTB.  No pelvic pain.  Normal vaginal secretions.  No pain with IC.   Pap test 12/16/2019 Neg.  Pap reflex today. Mictions/BMs wnl.  Left Breast normal.  Right breast with a small lump at the upper outer aspect x about 1-2 months, not increasing in size.  Mildly tender.  Good nutrition, physically active.  BMI 31.28.  ?- Cytology - PAP( [place]) ? ?2. Encounter for routine checking of intrauterine contraceptive device (IUD) ?Well on Mirena IUD x 12/23/2019.  No BTB.  No  pelvic pain.  Normal vaginal secretions.  No pain with IC. IUD in good location. ? ?3. Breast lump on right side at 11 o'clock position ?Left Breast normal.  Right breast with a small lump at the upper outer aspect x about 1-2 months, not increasing in size.  Mildly tender. Right breast nodule/cyst 1 cm, mobile, mildly tender at 11 O'Clock.  Probably breast cyst.  Will investigate with a Rt Dx mammo/US. ? ?4. Class 1 obesity due to excess calories without serious comorbidity with body mass index (BMI) of 31.0 to 31.9 in adult  ?Low calorie/carb diet to continue.  Continue with fitness, running 5 Ks. ? ?Princess Bruins MD, 11:34 AM 01/13/2022 ? ?  ?

## 2022-01-13 NOTE — Telephone Encounter (Signed)
Grace Bruins, MD  [facility] Triage ?Left Breast normal.  Right breast with a small lump at the upper outer aspect x about 1-2 months, not increasing in size.  Mildly tender. Right breast nodule/cyst 1 cm, mobile, mildly tender at 11 O'Clock.  Probably breast cyst.  Will investigate with a Rt Dx mammo/US.  ? ?Orders placed. Appt set for 01/22/22 @ 2:50pm.  ? ?Pt notified and voiced understanding. However, she reports that she will be out of town and will have to call and r/s.  ?

## 2022-01-13 NOTE — Telephone Encounter (Signed)
FYI. R/s for 02/05/22 @ 820.  ?

## 2022-01-15 LAB — CYTOLOGY - PAP: Diagnosis: NEGATIVE

## 2022-01-22 ENCOUNTER — Other Ambulatory Visit: Payer: No Typology Code available for payment source

## 2022-02-05 ENCOUNTER — Ambulatory Visit
Admission: RE | Admit: 2022-02-05 | Discharge: 2022-02-05 | Disposition: A | Discharge status: Home / Self Care | Payer: No Typology Code available for payment source | Source: Ambulatory Visit | Attending: Obstetrics & Gynecology | Admitting: Obstetrics & Gynecology

## 2022-02-05 ENCOUNTER — Other Ambulatory Visit: Payer: Self-pay | Admitting: Obstetrics & Gynecology

## 2022-02-05 DIAGNOSIS — N6311 Unspecified lump in the right breast, upper outer quadrant: Secondary | ICD-10-CM

## 2022-02-10 ENCOUNTER — Ambulatory Visit
Admission: RE | Admit: 2022-02-10 | Discharge: 2022-02-10 | Disposition: A | Discharge status: Home / Self Care | Payer: No Typology Code available for payment source | Source: Ambulatory Visit | Attending: Obstetrics & Gynecology | Admitting: Obstetrics & Gynecology

## 2022-02-10 ENCOUNTER — Other Ambulatory Visit: Payer: Self-pay | Admitting: Diagnostic Radiology

## 2022-02-10 DIAGNOSIS — N6311 Unspecified lump in the right breast, upper outer quadrant: Secondary | ICD-10-CM

## 2022-02-11 ENCOUNTER — Telehealth: Payer: Self-pay | Admitting: Hematology and Oncology

## 2022-02-11 NOTE — Telephone Encounter (Signed)
Spoke to patient to confirmed afternoon clinic appointment for 5/10, sent paperwork via e-mail ?

## 2022-02-16 NOTE — Progress Notes (Signed)
?Radiation Oncology         [phone] ?________________________________ ? ?Name: Grace Harvey        MRN: [id number]  ?Date of Service: 02/19/2022 DOB: 07/28/1981 ? ?CC:Shaw, Kimberlee, MD  Blackman, Douglas, MD    ? ?REFERRING PHYSICIAN: Blackman, Douglas, MD ? ? ?DIAGNOSIS: The encounter diagnosis was Malignant neoplasm of upper-outer quadrant of right breast in female, estrogen receptor positive (HCC). ? ? ?HISTORY OF PRESENT ILLNESS: Grace Harvey is a 41 y.o. female seen in the multidisciplinary breast clinic for a new diagnosis of right breast cancer. The patient was noted to have a palpable mass in the right breast.  By diagnostic imaging a mass in the 10 o'clock position in the right breast was seen measuring 1.8 cm by ultrasound.  Her axilla was negative for adenopathy.  A biopsy showed grade 2 invasive ductal carcinoma that was ER/PR positive, HER2 negative with a Ki-67 of 30%.  She is seen today for treatment recommendations of her cancer. ? ?Patient accompanied by her husband. He works in the [place] system.  ? ?She reports feeling that she has a good understanding of her diagnosis and next steps.  ? ?PREVIOUS RADIATION THERAPY: No ? ? ?PAST MEDICAL HISTORY:  ?Past Medical History:  ?Diagnosis Date  ? Cervical intraepithelial neoplasm HIGH GRADE  ?   ? ? ?PAST SURGICAL HISTORY: ?Past Surgical History:  ?Procedure Laterality Date  ? INTRAUTERINE DEVICE (IUD) INSERTION    ? mirena iud insertion 12-23-19  ? IUD REMOVAL  03/02/2012  ? MIRENA  ? laser vaporization of cervix  10/14/2011  ? RESET FX RIGHT ARM  AGE 9  ? WISDOM TOOTH EXTRACTION  03/04/2012  ? [facility]  ? ? ? ?FAMILY HISTORY:  ?Family History  ?Problem Relation Age of Onset  ? Breast cancer Maternal Aunt   ?     age 50  ? Hypertension Father   ? ? ? ?SOCIAL HISTORY:  reports that she has never smoked. She has never used smokeless tobacco. She reports that she does not drink alcohol and does not use drugs. The patient is married  and lives in [place]. She works at [employer].  ? ? ?ALLERGIES: Patient has no known allergies. ? ? ?MEDICATIONS:  ?Current Outpatient Medications  ?Medication Sig Dispense Refill  ? sertraline (ZOLOFT) 50 MG tablet TAke 1 &1/2 tablets by mouth Once a day 135 tablet 0  ? ?No current facility-administered medications for this encounter.  ? ? ? ?REVIEW OF SYSTEMS: On review of systems, the patient reports that she is doing well overall without systemic symptoms or major concerns. ?  ? ?PHYSICAL EXAM:  ?Wt Readings from Last 3 Encounters:  ?01/13/22 188 lb (85.3 kg)  ?12/16/19 180 lb (81.6 kg)  ?03/27/18 210 lb 8 oz (95.5 kg)  ? ?Temp Readings from Last 3 Encounters:  ?03/28/18 98 ?F (36.7 ?C) (Oral)  ?03/26/12 97.5 ?F (36.4 ?C)  ? ?BP Readings from Last 3 Encounters:  ?01/13/22 116/74  ?01/24/20 128/82  ?12/23/19 124/76  ? ?Pulse Readings from Last 3 Encounters:  ?01/13/22 72  ?03/28/18 63  ?03/26/12 61  ? ? ?In general this is a well appearing caucasian female in no acute distress. She's alert and oriented x4 and appropriate throughout the examination. Cardiopulmonary assessment is negative for acute distress and she exhibits normal effort. Bilateral breast exam is deferred. ? ? ?ECOG = 0 ? ?0 - Asymptomatic (Fully active, able to carry on all predisease activities   without restriction) ? ?1 - Symptomatic but completely ambulatory (Restricted in physically strenuous activity but ambulatory and able to carry out work of a light or sedentary nature. For example, light housework, office work) ? ?2 - Symptomatic, <50% in bed during the day (Ambulatory and capable of all self care but unable to carry out any work activities. Up and about more than 50% of waking hours) ? ?3 - Symptomatic, >50% in bed, but not bedbound (Capable of only limited self-care, confined to bed or chair 50% or more of waking hours) ? ?4 - Bedbound (Completely disabled. Cannot carry on any self-care. Totally confined to bed or chair) ? ?5 -  Death ? ? Oken MM, Creech RH, Tormey DC, et al. [phone]). "Toxicity and response criteria of the [facility]". [place] Oncol. 5 (6): 649-55 ? ? ? ?LABORATORY DATA:  ?Lab Results  ?Component Value Date  ? WBC 18.2 (H) 03/27/2018  ? HGB 12.1 03/27/2018  ? HCT 35.7 (L) 03/27/2018  ? MCV 89.9 03/27/2018  ? PLT 255 03/27/2018  ? ?Lab Results  ?Component Value Date  ? NA 141 06/11/2015  ? K 4.3 06/11/2015  ? CL 105 06/11/2015  ? CO2 26 06/11/2015  ? ?Lab Results  ?Component Value Date  ? ALT 9 06/11/2015  ? AST 12 06/11/2015  ? ALKPHOS 46 06/11/2015  ? BILITOT 0.5 06/11/2015  ? ?  ? ?RADIOGRAPHY: US BREAST LTD UNI RIGHT INC AXILLA ? ?Result Date: 02/05/2022 ?CLINICAL DATA:  RIGHT breast lump. This is patient's first mammogram. EXAM: DIGITAL DIAGNOSTIC BILATERAL MAMMOGRAM WITH TOMOSYNTHESIS AND CAD; ULTRASOUND RIGHT BREAST LIMITED TECHNIQUE: Bilateral digital diagnostic mammography and breast tomosynthesis was performed. The images were evaluated with computer-aided detection.; Targeted ultrasound examination of the right breast was performed COMPARISON:  None. ACR Breast Density Category c: The breast tissue is heterogeneously dense, which may obscure small masses. FINDINGS: There is a spiculated mass within the upper-outer quadrant of the RIGHT breast, measuring approximately 2 cm greatest dimension, corresponding to the palpable area of concern with overlying skin marker in place. There are no dominant masses, suspicious calcifications or secondary signs of malignancy elsewhere within either breast. Targeted ultrasound is performed, showing an irregular hypoechoic mass in the RIGHT breast at the 10 o'clock axis, 8 cm from the nipple, measuring 1.8 x 1.6 x 1.8 cm, with internal vascularity, with posterior acoustic shadowing, with associated architectural distortion, corresponding to the mammographic finding. RIGHT axilla was evaluated with ultrasound showing no enlarged or morphologically  abnormal lymph nodes. IMPRESSION: 1. Spiculated mass within the RIGHT breast at the 10 o'clock axis, 8 cm from the nipple, measuring 1.8 cm, corresponding to the palpable area of concern. Ultrasound-guided biopsy is recommended. 2. No evidence of malignancy within the LEFT breast. RECOMMENDATION: 1. Ultrasound-guided biopsy for the RIGHT breast mass at the 10 o'clock axis. 2. If pathology results are positive for malignancy, recommend breast MRI due to the patient's young age and dense breast tissues. Ultrasound-guided biopsy is scheduled on May 1st. I have discussed the findings and recommendations with the patient. If applicable, a reminder letter will be sent to the patient regarding the next appointment. BI-RADS CATEGORY  5: Highly suggestive of malignancy. Electronically Signed   By: Franki Cabot M.D.   On: 02/05/2022 09:24 ? ?MM DIAG BREAST TOMO BILATERAL ? ?Result Date: 02/05/2022 ?CLINICAL DATA:  RIGHT breast lump. This is patient's first mammogram. EXAM: DIGITAL DIAGNOSTIC BILATERAL MAMMOGRAM WITH TOMOSYNTHESIS AND CAD; ULTRASOUND RIGHT BREAST LIMITED TECHNIQUE:  Bilateral digital diagnostic mammography and breast tomosynthesis was performed. The images were evaluated with computer-aided detection.; Targeted ultrasound examination of the right breast was performed COMPARISON:  None. ACR Breast Density Category c: The breast tissue is heterogeneously dense, which may obscure small masses. FINDINGS: There is a spiculated mass within the upper-outer quadrant of the RIGHT breast, measuring approximately 2 cm greatest dimension, corresponding to the palpable area of concern with overlying skin marker in place. There are no dominant masses, suspicious calcifications or secondary signs of malignancy elsewhere within either breast. Targeted ultrasound is performed, showing an irregular hypoechoic mass in the RIGHT breast at the 10 o'clock axis, 8 cm from the nipple, measuring 1.8 x 1.6 x 1.8 cm, with internal  vascularity, with posterior acoustic shadowing, with associated architectural distortion, corresponding to the mammographic finding. RIGHT axilla was evaluated with ultrasound showing no enlarged or morphologically

## 2022-02-17 ENCOUNTER — Encounter: Payer: Self-pay | Admitting: *Deleted

## 2022-02-17 DIAGNOSIS — C50411 Malignant neoplasm of upper-outer quadrant of right female breast: Secondary | ICD-10-CM

## 2022-02-19 ENCOUNTER — Inpatient Hospital Stay (HOSPITAL_BASED_OUTPATIENT_CLINIC_OR_DEPARTMENT_OTHER): Payer: No Typology Code available for payment source | Admitting: Genetic Counselor

## 2022-02-19 ENCOUNTER — Other Ambulatory Visit: Payer: Self-pay | Admitting: Surgery

## 2022-02-19 ENCOUNTER — Encounter: Payer: Self-pay | Admitting: *Deleted

## 2022-02-19 ENCOUNTER — Encounter: Payer: Self-pay | Admitting: Physical Therapy

## 2022-02-19 ENCOUNTER — Encounter: Payer: Self-pay | Admitting: General Practice

## 2022-02-19 ENCOUNTER — Ambulatory Visit: Payer: No Typology Code available for payment source | Attending: Surgery | Admitting: Physical Therapy

## 2022-02-19 ENCOUNTER — Inpatient Hospital Stay: Payer: No Typology Code available for payment source | Attending: Hematology and Oncology

## 2022-02-19 ENCOUNTER — Encounter: Payer: Self-pay | Admitting: Genetic Counselor

## 2022-02-19 ENCOUNTER — Other Ambulatory Visit: Payer: Self-pay

## 2022-02-19 ENCOUNTER — Encounter: Payer: Self-pay | Admitting: Emergency Medicine

## 2022-02-19 ENCOUNTER — Ambulatory Visit
Admission: RE | Admit: 2022-02-19 | Discharge: 2022-02-19 | Disposition: A | Discharge status: Home / Self Care | Payer: No Typology Code available for payment source | Source: Ambulatory Visit | Attending: Radiation Oncology | Admitting: Radiation Oncology

## 2022-02-19 ENCOUNTER — Other Ambulatory Visit: Payer: Self-pay | Admitting: *Deleted

## 2022-02-19 ENCOUNTER — Inpatient Hospital Stay (HOSPITAL_BASED_OUTPATIENT_CLINIC_OR_DEPARTMENT_OTHER): Payer: No Typology Code available for payment source | Admitting: Hematology and Oncology

## 2022-02-19 DIAGNOSIS — C50411 Malignant neoplasm of upper-outer quadrant of right female breast: Secondary | ICD-10-CM

## 2022-02-19 DIAGNOSIS — Z8042 Family history of malignant neoplasm of prostate: Secondary | ICD-10-CM | POA: Diagnosis not present

## 2022-02-19 DIAGNOSIS — Z803 Family history of malignant neoplasm of breast: Secondary | ICD-10-CM

## 2022-02-19 DIAGNOSIS — Z170 Estrogen receptor positive status [ER+]: Secondary | ICD-10-CM

## 2022-02-19 DIAGNOSIS — F419 Anxiety disorder, unspecified: Secondary | ICD-10-CM | POA: Insufficient documentation

## 2022-02-19 DIAGNOSIS — Z853 Personal history of malignant neoplasm of breast: Secondary | ICD-10-CM

## 2022-02-19 DIAGNOSIS — R293 Abnormal posture: Secondary | ICD-10-CM | POA: Insufficient documentation

## 2022-02-19 HISTORY — DX: Family history of malignant neoplasm of breast: Z80.3

## 2022-02-19 HISTORY — DX: Family history of malignant neoplasm of prostate: Z80.42

## 2022-02-19 LAB — CBC WITH DIFFERENTIAL (CANCER CENTER ONLY)
Abs Immature Granulocytes: 0.01 10*3/uL (ref 0.00–0.07)
Basophils Absolute: 0 10*3/uL (ref 0.0–0.1)
Basophils Relative: 1 %
Eosinophils Absolute: 0.2 10*3/uL (ref 0.0–0.5)
Eosinophils Relative: 3 %
HCT: 42.6 % (ref 36.0–46.0)
Hemoglobin: 14.5 g/dL (ref 12.0–15.0)
Immature Granulocytes: 0 %
Lymphocytes Relative: 31 %
Lymphs Abs: 2.1 10*3/uL (ref 0.7–4.0)
MCH: 30 pg (ref 26.0–34.0)
MCHC: 34 g/dL (ref 30.0–36.0)
MCV: 88.2 fL (ref 80.0–100.0)
Monocytes Absolute: 0.5 10*3/uL (ref 0.1–1.0)
Monocytes Relative: 8 %
Neutro Abs: 4 10*3/uL (ref 1.7–7.7)
Neutrophils Relative %: 57 %
Platelet Count: 306 10*3/uL (ref 150–400)
RBC: 4.83 MIL/uL (ref 3.87–5.11)
RDW: 11.8 % (ref 11.5–15.5)
WBC Count: 6.9 10*3/uL (ref 4.0–10.5)
nRBC: 0 % (ref 0.0–0.2)

## 2022-02-19 LAB — CMP (CANCER CENTER ONLY)
ALT: 14 U/L (ref 0–44)
AST: 12 U/L — ABNORMAL LOW (ref 15–41)
Albumin: 4.3 g/dL (ref 3.5–5.0)
Alkaline Phosphatase: 45 U/L (ref 38–126)
Anion gap: 7 (ref 5–15)
BUN: 19 mg/dL (ref 6–20)
CO2: 24 mmol/L (ref 22–32)
Calcium: 9.3 mg/dL (ref 8.9–10.3)
Chloride: 106 mmol/L (ref 98–111)
Creatinine: 0.74 mg/dL (ref 0.44–1.00)
GFR, Estimated: >60 mL/min (ref >60)
Glucose, Bld: 98 mg/dL (ref 70–99)
Potassium: 3.8 mmol/L (ref 3.5–5.1)
Sodium: 137 mmol/L (ref 135–145)
Total Bilirubin: 0.5 mg/dL (ref 0.3–1.2)
Total Protein: 7.6 g/dL (ref 6.5–8.1)

## 2022-02-19 LAB — GENETIC SCREENING ORDER

## 2022-02-19 NOTE — Assessment & Plan Note (Signed)
02/10/2022:Palpable right breast mass UOQ spiculated mass at 10 o'clock position 1.8 cm, axilla negative, ultrasound-guided biopsy: Grade 2 IDC, ER 100%, PR 100%, HER2 equivocal by IHC, FISH negative, Ki-67 30% ? ?Pathology and radiology counseling:Discussed with the patient, the details of pathology including the type of breast cancer,the clinical staging, the significance of ER, PR and HER-2/neu receptors and the implications for treatment. After reviewing the pathology in detail, we proceeded to discuss the different treatment options between surgery, radiation, chemotherapy, antiestrogen therapies. ? ?Recommendations: ?1. Breast conserving surgery followed by ?2. Oncotype DX testing to determine if chemotherapy would be of any benefit followed by ?3. Adjuvant radiation therapy followed by ?4. Adjuvant antiestrogen therapy ? ?Genetic testing ? ?Oncotype counseling: I discussed Oncotype DX test. I explained to the patient that this is a 21 gene panel to evaluate patient tumors DNA to calculate recurrence score. This would help determine whether patient has high risk or low risk breast cancer. She understands that if her tumor was found to be high risk, she would benefit from systemic chemotherapy. If low risk, no need of chemotherapy. ? ?Return to clinic after surgery to discuss final pathology report and then determine if Oncotype DX testing will need to be sent. ? ? ?

## 2022-02-19 NOTE — Progress Notes (Signed)
REFERRING PROVIDER: ?Nicholas Lose, MD ?[address] ?[place],  [place] [address] ? ?PRIMARY PROVIDER:  ?Mayra Neer, MD ? ?PRIMARY REASON FOR VISIT:  ?1. Malignant neoplasm of upper-outer quadrant of right breast in female, estrogen receptor positive ([place])   ?2. Family history of breast cancer   ?3. Family history of prostate cancer   ? ? ?HISTORY OF PRESENT ILLNESS:   ?Grace Harvey, a 41 y.o. female, was seen for a [place] cancer genetics consultation during the breast multidisciplinary clinic at the request of Dr. Lindi Adie due to a personal and family history of breast cancer.  Grace Harvey presents to clinic today to discuss the possibility of a hereditary predisposition to cancer, to discuss genetic testing, and to further clarify her future cancer risks, as well as potential cancer risks for family members.  ? ?In May 2023, at the age of 24, Grace Harvey was diagnosed with invasive ductal carcinoma of the right breast (ER+/PR+/HER2-). The preliminary treatment plan includes breast conserving surgery, Oncotype DX to determine potential benefit of chemotherapy, adjuvant radiation, and anti-estrogens.  ? ?CANCER HISTORY:  ?Oncology History  ?Malignant neoplasm of upper-outer quadrant of right breast in female, estrogen receptor positive ([place])  ?02/10/2022 Initial Diagnosis  ? Palpable right breast mass UOQ spiculated mass at 10 o'clock position 1.8 cm, axilla negative, ultrasound-guided biopsy: Grade 2 IDC, ER 100%, PR 100%, HER2 equivocal by IHC, FISH negative, Ki-67 30% ?  ?02/19/2022 Cancer Staging  ? Staging form: Breast, AJCC 8th Edition ?- Clinical: Stage IA (cT1c, cN0, cM0, G2, ER+, PR+, HER2-) - Signed by Nicholas Lose, MD on 02/19/2022 ?Stage prefix: Initial diagnosis ?Histologic grading system: 3 grade system ? ?  ? ? ? ?RISK FACTORS:  ?Colonoscopy: no; not examined. ?Hysterectomy: no.  ?Ovaries intact: yes.  ?Up to date with pelvic exams: yes. ?Menarche was at age 46.  ?First live birth at age 40.   ?OCP use for approximately 0 years.  ? ? ?Past Medical History:  ?Diagnosis Date  ? Cervical intraepithelial neoplasm HIGH GRADE  ? Family history of breast cancer 02/19/2022  ? Family history of prostate cancer 02/19/2022  ? ? ?Past Surgical History:  ?Procedure Laterality Date  ? INTRAUTERINE DEVICE (IUD) INSERTION    ? mirena iud insertion 12-23-19  ? IUD REMOVAL  03/02/2012  ? MIRENA  ? laser vaporization of cervix  10/14/2011  ? RESET FX RIGHT ARM  AGE 74  ? WISDOM TOOTH EXTRACTION  03/04/2012  ? [facility] SURGEON OFFICE  ? ? ? ?FAMILY HISTORY:  ?We obtained a detailed, 4-generation family history.  Significant diagnoses are listed below: ?Family History  ?Problem Relation Age of Onset  ? Prostate cancer Father 67  ? Breast cancer Maternal Aunt 65  ? Breast cancer Other   ?     MGM's sister; dx 43s  ? ? ?Ms. Vaeth is unaware of previous family history of genetic testing for hereditary cancer risks. There is no reported Ashkenazi Jewish ancestry. There is no known consanguinity. ? ?GENETIC COUNSELING ASSESSMENT: Grace Harvey is a 41 y.o. female with a personal and family history of cancer which is somewhat suggestive of a hereditary cancer syndrome and predisposition to cancer given her age of diagnosis and the presence of related cancers in the family. We, therefore, discussed and recommended the following at today's visit.  ? ?DISCUSSION: We discussed that 5 - 10% of cancer is hereditary, with most cases of hereditary breast cancer associated with mutations in BRCA1/2.  There are other genes  that can be associated with hereditary breast cancer syndromes.  Type of cancer risk and level of risk are gene-specific. We discussed that testing is beneficial for several reasons including knowing how to follow individuals after completing their treatment, identifying whether potential treatment options would be beneficial, and understanding if other family members could be at risk for cancer and allowing them to undergo genetic  testing.  ? ?We reviewed the characteristics, features and inheritance patterns of hereditary cancer syndromes. We also discussed genetic testing, including the appropriate family members to test, the process of testing, insurance coverage and turn-around-time for results. We discussed the implications of a negative, positive and/or variant of uncertain significant result. In order to get genetic test results in a timely manner so that Grace Harvey can use these genetic test results for surgical decisions, we recommended Grace Harvey pursue genetic testing for the [employer].  The BRCAplus panel offered by [employer] and includes sequencing and deletion/duplication analysis for the following 8 genes: ATM, BRCA1, BRCA2, CDH1, CHEK2, PALB2, PTEN, and TP53. Once complete, we recommend Grace Harvey pursue reflex genetic testing to a more comprehensive gene panel.  ? ?Grace Harvey  was offered a common hereditary cancer panel (47 genes) and an expanded pan-cancer panel (77 genes). Grace Harvey was informed of the benefits and limitations of each panel, including that expanded pan-cancer panels contain genes that do not have clear management guidelines at this point in time.  We also discussed that as the number of genes included on a panel increases, the chances of variants of uncertain significance increases.  After considering the benefits and limitations of each gene panel, Grace Harvey  elected to have a common hereditary cancers panel through [employer]. ? ?The CustomNext-Cancer+RNAinsight panel offered by [facility] includes sequencing and rearrangement analysis for the following 47 genes:  APC, ATM, AXIN2, BARD1, BMPR1A, BRCA1, BRCA2, BRIP1, CDH1, CDK4, CDKN2A, CHEK2, DICER1, EPCAM, GREM1, HOXB13, MEN1, MLH1, MSH2, MSH3, MSH6, MUTYH, NBN, NF1, NF2, NTHL1, PALB2, PMS2, POLD1, POLE, PTEN, RAD51C, RAD51D, RECQL, RET, SDHA, SDHAF2, SDHB, SDHC, SDHD, SMAD4, SMARCA4, STK11, TP53, TSC1, TSC2, and VHL.  RNA data is  routinely analyzed for use in variant interpretation for all genes. ? ?Based on Grace Harvey's personal history of breast cancer before age 20, she meets medical criteria for genetic testing. Despite that she meets criteria, she may still have an out of pocket cost. We discussed that if her out of pocket cost for testing is over $100, the laboratory should contact them to discuss self-pay prices, patient pay assistance programs, if applicable, and other billing options.  ? ?PLAN: After considering the risks, benefits, and limitations, Grace Harvey provided informed consent to pursue genetic testing and the blood sample was sent to [facility] for analysis of the [employer] and CustomNext-Cancer +RNAinsight Panel. Results should be available within approximately 1-2 weeks' time, at which point they will be disclosed by telephone to Grace Harvey, as will any additional recommendations warranted by these results. Grace Harvey will receive a summary of her genetic counseling visit and a copy of her results once available. This information will also be available in Epic.  ? ?Based on Grace Harvey's family history, we also recommended her maternal aunt, who was diagnosed with breast cancer at age 8, have genetic counseling and testing. Grace Harvey can let us know if we can be of any assistance in coordinating genetic counseling and/or testing for this family member.  ? ?Lastly, we encouraged Ms. Vanduyn to  remain in contact with cancer genetics annually so that we can continuously update the family history and inform her of any changes in cancer genetics and testing that may be of benefit for this family.  ? ?Ms. Demo's questions were answered to her satisfaction today. Our contact information was provided should additional questions or concerns arise. Thank you for the referral and allowing [place] to share in the care of your patient.  ? ?Maurita Havener M. Joette Catching, [place], [place] ?Genetic Counselor ?[email or web address] ?(P)  [phone] ? ?The patient was seen for a total of 20 minutes in face-to-face genetic counseling.  The patient was accompanied by her husband, Aaron Edelman. Drs. Lindi Adie and/or Burr Medico were available to discuss this case as ne

## 2022-02-19 NOTE — Therapy (Signed)
?OUTPATIENT PHYSICAL THERAPY BREAST CANCER BASELINE EVALUATION ? ? ?Patient Name: Grace Harvey ?MRN: [id number] ?DOB:December 29, 1980, 41 y.o., female ?Today's Date: 02/19/2022 ? ? PT End of Session - 02/19/22 1545   ? ? Visit Number 1   ? Number of Visits 2   ? Date for PT Re-Evaluation 04/16/22   ? PT Start Time 1315   ? PT Stop Time 6160   Also saw pt from 1345-1356 and from 1441-1452 for a total of 34 min  ? PT Time Calculation (min) 12 min   ? Activity Tolerance Patient tolerated treatment well   ? Behavior During Therapy [facility] for tasks assessed/performed   ? ?  ?  ? ?  ? ? ?Past Medical History:  ?Diagnosis Date  ? Cervical intraepithelial neoplasm HIGH GRADE  ? Family history of breast cancer 02/19/2022  ? Family history of prostate cancer 02/19/2022  ? ?Past Surgical History:  ?Procedure Laterality Date  ? INTRAUTERINE DEVICE (IUD) INSERTION    ? mirena iud insertion 12-23-19  ? IUD REMOVAL  03/02/2012  ? MIRENA  ? laser vaporization of cervix  10/14/2011  ? RESET FX RIGHT ARM  AGE 83  ? WISDOM TOOTH EXTRACTION  03/04/2012  ? ORAL SURGEON OFFICE  ? ?Patient Active Problem List  ? Diagnosis Date Noted  ? Family history of breast cancer 02/19/2022  ? Family history of prostate cancer 02/19/2022  ? Malignant neoplasm of upper-outer quadrant of right breast in female, estrogen receptor positive ([place]) 02/17/2022  ? SVD 6/15 03/27/2018  ? Postpartum care following vaginal delivery 03/27/2018  ? Second-degree perineal laceration, with delivery 03/27/2018  ? ? ?PCP: Dr. Mayra Neer ? ?REFERRING PROVIDER: Dr. Coralie Keens ? ?REFERRING DIAG: Right breast cancer ? ?THERAPY DIAG:  ?Malignant neoplasm of upper-outer quadrant of right breast in female, estrogen receptor positive ([place]) ? ?Abnormal posture ? ?ONSET DATE: 02/10/2022 ? ?SUBJECTIVE                                                                                                                                                                                           ? ?SUBJECTIVE STATEMENT: ?Patient reports she is here today to be seen by her medical team for her newly diagnosed right breast cancer.  ? ?PERTINENT HISTORY:  ?Patient was diagnosed on 02/10/2022 with right grade 2 invasive ductal carcinoma breast cancer. It measures 1.8 cm and is located in the upper outer quadrant. It is ER/PR positive and HER2 negative with a Ki67 of 30%.  ? ?PATIENT GOALS   reduce lymphedema risk and learn post op HEP.  ? ?PAIN:  ?Are you having pain? No ? ? ?  PRECAUTIONS: Active CA  ? ?HAND DOMINANCE: right ? ?WEIGHT BEARING RESTRICTIONS No ? ?FALLS:  ?Has patient fallen in last 6 months? No ? ?LIVING ENVIRONMENT: ?Patient lives with: her husband and 2 daughters ages 30 and 50 ?Lives in: House/apartment ?Has following equipment at home: None ? 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 ?LEISURE: She does the [employer] once a week and walks 5x/week for 2-3 miles ? ?PRIOR LEVEL OF FUNCTION: Independent ? ? ?OBJECTIVE ? ?COGNITION: ? Overall cognitive status: Within functional limits for tasks assessed   ? ?POSTURE:  ?Forward head and rounded shoulders posture ? ?UPPER EXTREMITY AROM/PROM: ? ?A/PROM RIGHT  02/19/2022 ?  ?Shoulder extension 51  ?Shoulder flexion 152  ?Shoulder abduction 167  ?Shoulder internal rotation 78  ?Shoulder external rotation 84  ?  (Blank rows = not tested) ? ?A/PROM LEFT  02/19/2022  ?Shoulder extension 48  ?Shoulder flexion 146  ?Shoulder abduction 170  ?Shoulder internal rotation 70  ?Shoulder external rotation 90  ?  (Blank rows = not tested) ? ? ?CERVICAL AROM: ?All within normal limits ? ?UPPER EXTREMITY STRENGTH: WNL ? ? ?LYMPHEDEMA ASSESSMENTS:  ? ?[place] RIGHT  02/19/2022  ?10 cm proximal to olecranon process 29.5  ?Olecranon process 24.3  ?10 cm proximal to ulnar styloid process 22.3  ?Just proximal to ulnar styloid process 15.8  ?Across hand at thumb web space 18.7  ?At base of 2nd digit 6  ?(Blank rows = not tested) ? ?[place] LEFT  02/19/2022  ?10 cm  proximal to olecranon process 29.5  ?Olecranon process 24.6  ?10 cm proximal to ulnar styloid process 20.8  ?Just proximal to ulnar styloid process 15.8  ?Across hand at thumb web space 17.6  ?At base of 2nd digit 6  ?(Blank rows = not tested) ? ? ?L-DEX LYMPHEDEMA SCREENING: ? ?The patient was assessed using the L-Dex machine today to produce a lymphedema index baseline score. The patient will be reassessed on a regular basis (typically every 3 months) to obtain new L-Dex scores. If the score is > 6.5 points away from his/her baseline score indicating onset of subclinical lymphedema, it will be recommended to wear a compression garment for 4 weeks, 12 hours per day and then be reassessed. If the score continues to be > 6.5 points from baseline at reassessment, we will initiate lymphedema treatment. Assessing in this manner has a 95% rate of preventing clinically significant lymphedema. ? ? L-DEX FLOWSHEETS - 02/19/22 1500   ? ?  ? L-DEX LYMPHEDEMA SCREENING  ? Measurement Type Unilateral   ? L-DEX MEASUREMENT EXTREMITY Upper Extremity   ? POSITION  Standing   ? DOMINANT SIDE Right   ? At Risk Side Right   ? BASELINE SCORE (UNILATERAL) -4.5   ? ?  ?  ? ?  ? ? ? ?QUICK DASH SURVEY: ? ? ?PATIENT EDUCATION:  ?Education details: Lymphedema risk reduction and post op shoulder/posture HEP ?Person educated: Patient ?Education method: Explanation, Demonstration, Handout ?Education comprehension: Patient verbalized understanding and returned demonstration ? ? ?HOME EXERCISE PROGRAM: ?Patient was instructed today in a home exercise program today for post op shoulder range of motion. These included active assist shoulder flexion in sitting, scapular retraction, wall walking with shoulder abduction, and hands behind head external rotation.  She was encouraged to do these twice a day, holding 3 seconds and repeating 5 times when permitted by her physician. ? ? ?ASSESSMENT: ? ?CLINICAL IMPRESSION: ?Patient was diagnosed on  02/10/2022 with right grade 2 invasive ductal carcinoma  breast cancer. It measures 1.8 cm and is located in the upper outer quadrant. It is ER/PR positive and HER2 negative with a Ki67 of 30%. Her multidisciplinary medical team met prior to her assessments to determine a recommended treatment plan. She is planning to have a right lumpectomy and sentinel node biopsy followed by Oncotype testing, radiation, and anti-estrogen therapy. She will benefit from a post op PT reassessment to determine needs and from L-Dex screens every 3 months for 2 years to detect subclinical lymphedema. ? ?Pt will benefit from skilled therapeutic intervention to improve on the following deficits: Decreased knowledge of precautions, impaired UE functional use, pain, decreased ROM, postural dysfunction.  ? ?PT treatment/interventions: ADL/self-care home management, pt/family education, therapeutic exercise ? ?REHAB POTENTIAL: Excellent ? ?CLINICAL DECISION MAKING: Stable/uncomplicated ? ?EVALUATION COMPLEXITY: Low ? ? ?GOALS: ?Goals reviewed with patient? YES ? ?LONG TERM GOALS: (STG=LTG) ? ? Name Target Date Goal status  ?1 Pt will be able to verbalize understanding of pertinent lymphedema risk reduction practices relevant to her dx specifically related to skin care.  ?Baseline:  No knowledge 02/19/2022 Achieved at eval  ?2 Pt will be able to return demo and/or verbalize understanding of the post op HEP related to regaining shoulder ROM. ?Baseline:  No knowledge 02/19/2022 Achieved at eval  ?3 Pt will be able to verbalize understanding of the importance of attending the post op After Breast CA Class for further lymphedema risk reduction education and therapeutic exercise.  ?Baseline:  No knowledge 02/19/2022 Achieved at eval  ?4 Pt will demo she has regained full shoulder ROM and function post operatively compared to baselines.  ?Baseline: See objective measurements taken today. 04/16/2022   ? ? ? ?PLAN: ?PT FREQUENCY/DURATION: EVAL and 1 follow  up appointment.  ? ?PLAN FOR NEXT SESSION: will reassess 3-4 weeks post op to determine needs. ?  ?Patient will follow up at outpatient cancer rehab 3-4 weeks following surgery.  If the patient requires physical th

## 2022-02-19 NOTE — Research (Signed)
[employer] 2021-05 - Specimen Collection Study to Evaluate Biomarkers in Subjects with Cancer  ? ?02/19/22 ? ?Patient Grace Harvey was identified by Dr. Lindi Adie as a potential candidate for the above listed study.  This Clinical Research Coordinator met with Grace Harvey, [id number], on 02/19/22 in a manner and location that ensures patient privacy to discuss participation in the above listed research study.  Patient is Accompanied by her husband .  A copy of the informed consent document with embedded HIPAA language was provided to the patient.  Patient reads, speaks, and understands [place].   ? ?Patient was provided with the business card of this Coordinator and encouraged to contact the research team with any questions.  Approximately 10 minutes were spent with the patient reviewing the informed consent documents.  Patient was provided the option of taking informed consent documents home to review and was encouraged to review at their convenience with their support network, including other care providers. Patient took the consent documents home to review. ? ?Will follow up with patient to determine interest and review next steps. ? ?Clabe Seal ?Clinical Research Coordinator I  ?02/19/22 4:23 PM ? ?

## 2022-02-19 NOTE — Progress Notes (Signed)
[facility] Psychosocial Distress Screening ?Spiritual Care ? ?Met with Shandrell and her husband in [facility] to introduce [place] team/resources, reviewing distress screen per protocol.  The patient scored a 2 on the Psychosocial Distress Thermometer which indicates mild distress. Also assessed for distress and other psychosocial needs.  ? ? 02/19/2022  ?ONCBCN DISTRESS SCREENING   ?Screening Type Initial Screening   ?Distress experienced in past week (1-10) 2   ?Emotional problem type Nervousness/Anxiety   ?Referral to support programs Yes   ? ?Kelci reports good support from family and friends, which is keeping her encouraged and lifted up. She notes that she already has tools on board for coping with anxiety. Couple is speaking candidly with children about Dessie's diagnosis and is aware of resources such as [facility] if additional needs arise. ? ?Provided empathic listening, normalization of feelings, affirmation of strengths, and introduction to [facility] and [employer] support programming. ? ?Follow up needed: No. Per Lolita Patella, no other needs at this time, but she knows to contact chaplain if circumstances change. ? ? ?Chaplain Lorrin Jackson, MDiv, [facility] ?Pager [phone] ?Voicemail [phone] ? ? ? ? ?  ?

## 2022-02-19 NOTE — Progress Notes (Signed)
[place] ?CONSULT NOTE ? ?Patient Care Team: ?Mayra Neer, MD as PCP - General (Family Medicine) ?Coralie Keens, MD as Consulting Physician (General Surgery) ?Nicholas Lose, MD as Consulting Physician (Hematology and Oncology) ?Kyung Rudd, MD as Consulting Physician (Radiation Oncology) ?Mauro Kaufmann, RN as Oncology Nurse Navigator ?Rockwell Germany, RN as Oncology Nurse Navigator ? ?CHIEF COMPLAINTS/PURPOSE OF CONSULTATION:  ?Newly diagnosed breast cancer ? ?HISTORY OF PRESENTING ILLNESS:  ?Grace Harvey 41 y.o. female is here because of recent diagnosis of right breast cancer. ?Screening mammogram detected right breast mass in the RIGHT breast at the 10 o'clock axis, 8 cm from the nipple, measuring 1.8 x 1.6 x 1.8 cm, with internal vascularity,with posterior acoustic shadowing, with associated architectural distortion, corresponding to the mammographic finding ? ?Diagnostic mammogram showed there is a spiculated mass within the upper-outer quadrant of the RIGHT breast, measuring approximately 2 cm greatest dimension, the tumor was ER 100%, PR 100%, grade 2 invasive ductal carcinoma, HER2 FISH negative, Ki-67 30% ? ?I reviewed her records extensively and collaborated the history with the patient. ? ?SUMMARY OF ONCOLOGIC HISTORY: ?Oncology History  ?Malignant neoplasm of upper-outer quadrant of right breast in female, estrogen receptor positive ([place])  ?02/10/2022 Initial Diagnosis  ? Palpable right breast mass UOQ spiculated mass at 10 o'clock position 1.8 cm, axilla negative, ultrasound-guided biopsy: Grade 2 IDC, ER 100%, PR 100%, HER2 equivocal by IHC, FISH negative, Ki-67 30% ?  ?02/19/2022 Cancer Staging  ? Staging form: Breast, AJCC 8th Edition ?- Clinical: Stage IA (cT1c, cN0, cM0, G2, ER+, PR+, HER2-) - Signed by Nicholas Lose, MD on 02/19/2022 ?Stage prefix: Initial diagnosis ?Histologic grading system: 3 grade system ? ?  ? ? ? ?MEDICAL HISTORY:  ?Past Medical History:  ?Diagnosis  Date  ? Cervical intraepithelial neoplasm HIGH GRADE  ? ? ?SURGICAL HISTORY: ?Past Surgical History:  ?Procedure Laterality Date  ? INTRAUTERINE DEVICE (IUD) INSERTION    ? mirena iud insertion 12-23-19  ? IUD REMOVAL  03/02/2012  ? MIRENA  ? laser vaporization of cervix  10/14/2011  ? RESET FX RIGHT ARM  AGE 54  ? WISDOM TOOTH EXTRACTION  03/04/2012  ? ORAL SURGEON OFFICE  ? ? ?SOCIAL HISTORY: ?Social History  ? ?Socioeconomic History  ? Marital status: Married  ?  Spouse name: Not on file  ? Number of children: Not on file  ? Years of education: Not on file  ? Highest education level: Not on file  ?Occupational History  ? Not on file  ?Tobacco Use  ? Smoking status: Never  ? Smokeless tobacco: Never  ?Vaping Use  ? Vaping Use: Never used  ?Substance and Sexual Activity  ? Alcohol use: No  ?  Alcohol/week: 0.0 standard drinks  ? Drug use: No  ? Sexual activity: Yes  ?  Partners: Male  ?  Birth control/protection: I.U.D.  ?Other Topics Concern  ? Not on file  ?Social History Narrative  ? Not on file  ? ?Social Determinants of Health  ? ?Financial Resource Strain: Not on file  ?Food Insecurity: Not on file  ?Transportation Needs: Not on file  ?Physical Activity: Not on file  ?Stress: Not on file  ?Social Connections: Not on file  ?Intimate Partner Violence: Not on file  ? ? ?FAMILY HISTORY: ?Family History  ?Problem Relation Age of Onset  ? Hypertension Father   ? Prostate cancer Father   ? Breast cancer Maternal Aunt   ?     age 71  ? ? ?  ALLERGIES:  has No Known Allergies. ? ?MEDICATIONS:  ?Current Outpatient Medications  ?Medication Sig Dispense Refill  ? sertraline (ZOLOFT) 50 MG tablet TAke 1 &1/2 tablets by mouth Once a day 135 tablet 0  ? ?No current facility-administered medications for this visit.  ? ? ?REVIEW OF SYSTEMS:   ?All other systems were reviewed with the patient and are negative. ? ?PHYSICAL EXAMINATION: ?ECOG PERFORMANCE STATUS: 0 - Asymptomatic ? ?Vitals:  ? 02/19/22 1256  ?BP: 132/89  ?Pulse: 77   ?Resp: 18  ?Temp: 97.9 ?F (36.6 ?C)  ?SpO2: 100%  ? ?Filed Weights  ? 02/19/22 1256  ?Weight: 186 lb 6.4 oz (84.6 kg)  ? ? ? ?LABORATORY DATA:  ?I have reviewed the data as listed ?Lab Results  ?Component Value Date  ? WBC 6.9 02/19/2022  ? HGB 14.5 02/19/2022  ? HCT 42.6 02/19/2022  ? MCV 88.2 02/19/2022  ? PLT 306 02/19/2022  ? ?Lab Results  ?Component Value Date  ? NA 137 02/19/2022  ? K 3.8 02/19/2022  ? CL 106 02/19/2022  ? CO2 24 02/19/2022  ? ? ?RADIOGRAPHIC STUDIES: ?I have personally reviewed the radiological reports and agreed with the findings in the report. ? ?ASSESSMENT AND PLAN:  ?Malignant neoplasm of upper-outer quadrant of right breast in female, estrogen receptor positive ([place]) ?02/10/2022:Palpable right breast mass UOQ spiculated mass at 10 o'clock position 1.8 cm, axilla negative, ultrasound-guided biopsy: Grade 2 IDC, ER 100%, PR 100%, HER2 equivocal by IHC, FISH negative, Ki-67 30% ? ?Pathology and radiology counseling:Discussed with the patient, the details of pathology including the type of breast cancer,the clinical staging, the significance of ER, PR and HER-2/neu receptors and the implications for treatment. After reviewing the pathology in detail, we proceeded to discuss the different treatment options between surgery, radiation, chemotherapy, antiestrogen therapies. ? ?Recommendations: ?1. Breast conserving surgery followed by ?2. Oncotype DX testing to determine if chemotherapy would be of any benefit followed by ?3. Adjuvant radiation therapy followed by ?4. Adjuvant antiestrogen therapy ? ?Genetic testing ? ?Oncotype counseling: I discussed Oncotype DX test. I explained to the patient that this is a 21 gene panel to evaluate patient tumors DNA to calculate recurrence score. This would help determine whether patient has high risk or low risk breast cancer. She understands that if her tumor was found to be high risk, she would benefit from systemic chemotherapy. If low risk, no need of  chemotherapy. ? ?Return to clinic after surgery to discuss final pathology report and then determine if Oncotype DX testing will need to be sent. ? ? ? ?All questions were answered. The patient knows to call the clinic with any problems, questions or concerns. ?  ? Harriette Ohara, MD ?02/19/22 ? I Gardiner Coins am scribing for Dr. Lindi Adie ? ?I have reviewed the above documentation for accuracy and completeness, and I agree with the above. ?  ?

## 2022-02-20 ENCOUNTER — Ambulatory Visit (INDEPENDENT_AMBULATORY_CARE_PROVIDER_SITE_OTHER): Payer: No Typology Code available for payment source | Admitting: Plastic Surgery

## 2022-02-20 VITALS — BP 133/89 | HR 73 | Temp 98.2°F | Resp 18

## 2022-02-20 DIAGNOSIS — C50411 Malignant neoplasm of upper-outer quadrant of right female breast: Secondary | ICD-10-CM

## 2022-02-20 DIAGNOSIS — Z170 Estrogen receptor positive status [ER+]: Secondary | ICD-10-CM | POA: Diagnosis not present

## 2022-02-20 NOTE — Progress Notes (Signed)
? ?  Referring Provider ?Mayra Neer, MD ?[address] ?[address] ?[place],  [place] [address]  ? ?CC:  ?Chief Complaint  ?Patient presents with  ? Consult  ?   ? ?Grace Harvey is an 41 y.o. female.  ?HPI: Patient presents to discuss oncoplastic breast reduction.  She has a newly diagnosed breast cancer on the right side.  She has seen Dr. Ninfa Linden and discussed oncoplastic reduction.  She has wanted a breast reduction for some time anyhow due to her breast being larger than she would like and back pain related symptoms.  She has no previous breast biopsies or procedures herself.  She does not smoke and is not diabetic.  She is still waiting on results from genetic screening which could potentially alter the surgical plan for her treatment. ? ?No Known Allergies ? ?Outpatient Encounter Medications as of 02/20/2022  ?Medication Sig  ? sertraline (ZOLOFT) 50 MG tablet TAke 1 &1/2 tablets by mouth Once a day  ? ?No facility-administered encounter medications on file as of 02/20/2022.  ?  ? ?Past Medical History:  ?Diagnosis Date  ? Cervical intraepithelial neoplasm HIGH GRADE  ? Family history of breast cancer 02/19/2022  ? Family history of prostate cancer 02/19/2022  ? ? ?Past Surgical History:  ?Procedure Laterality Date  ? INTRAUTERINE DEVICE (IUD) INSERTION    ? mirena iud insertion 12-23-19  ? IUD REMOVAL  03/02/2012  ? MIRENA  ? laser vaporization of cervix  10/14/2011  ? RESET FX RIGHT ARM  AGE 37  ? WISDOM TOOTH EXTRACTION  03/04/2012  ? ORAL SURGEON OFFICE  ? ? ?Family History  ?Problem Relation Age of Onset  ? Hypertension Father   ? Prostate cancer Father 48  ? Breast cancer Maternal Aunt 75  ? Breast cancer Other   ?     MGM's sister; dx 110s  ? ? ?Social History  ? ?Social History Narrative  ? Not on file  ?  ? ?Review of Systems ?General: Denies fevers, chills, weight loss ?CV: Denies chest pain, shortness of breath, palpitations ? ?Physical Exam ? ?  02/20/2022  ? 11:36 AM 02/19/2022  ? 12:56 PM 01/13/2022   ? 11:18 AM  ?Vitals with BMI  ?Height  '5\' 5"'$  '5\' 5"'$   ?Weight  186 lbs 6 oz 188 lbs  ?BMI  31.02 31.28  ?Systolic 604 540 981  ?Diastolic 89 89 74  ?Pulse 73 77 72  ?  ?General:  No acute distress,  Alert and oriented, Non-Toxic, Normal speech and affect ?Breast: She has grade 3 ptosis.  Sternal notch to nipple is 30 cm bilaterally.  Nipple to fold is 15 cm bilaterally.  She is slightly larger on the right.  No obvious scars. ? ?Assessment/Plan ?Patient is a good candidate for oncoplastic breast reduction.  We discussed risks include bleeding, infection, damage to surrounding structures need for additional procedures.  We discussed additional risk that involve blood supply to the nipple.  She is in favor of doing the breast reduction on the same day as the lumpectomy.  I explained the location and orientation of the scars and the anticipated recovery afterwards.  She would likely need radiation after surgery and I explained that that could alter the breast shape in the future.  She is fully understanding of the pros and cons and interested in moving forward. ? ?Cindra Presume ?02/20/2022, 12:37 PM  ? ? ?  ?

## 2022-02-21 ENCOUNTER — Other Ambulatory Visit: Payer: Self-pay | Admitting: Surgery

## 2022-02-21 ENCOUNTER — Telehealth: Payer: Self-pay | Admitting: Emergency Medicine

## 2022-02-21 DIAGNOSIS — Z853 Personal history of malignant neoplasm of breast: Secondary | ICD-10-CM

## 2022-02-21 NOTE — Telephone Encounter (Signed)
[employer] 2021-05 - Specimen Collection Study to Evaluate Biomarkers in Subjects with Cancer  ? ?02/21/22 ? ?Called to follow up regarding interest in this study.  Patient did not answer, left voicemail requesting return call. ? ?Clabe Seal ?Clinical Research Coordinator I  ?02/21/22  1:59 PM  ?

## 2022-02-24 ENCOUNTER — Ambulatory Visit (HOSPITAL_COMMUNITY): Payer: No Typology Code available for payment source

## 2022-02-24 ENCOUNTER — Telehealth: Payer: Self-pay | Admitting: Emergency Medicine

## 2022-02-24 ENCOUNTER — Encounter (HOSPITAL_COMMUNITY): Payer: Self-pay

## 2022-02-24 NOTE — Telephone Encounter (Signed)
[employer] 2021-05 - Specimen Collection Study to Evaluate Biomarkers in Subjects with Cancer  ? ?02/24/22 ? ?Patient called and left voicemail this afternoon.  Called patient back and scheduled research consent appointment tomorrow at 11:30am followed by lab at 12:00pm.  Patient denied questions at this time. ? ?Clabe Seal ?Clinical Research Coordinator I  ?02/24/22  1:24 PM ? ?

## 2022-02-25 ENCOUNTER — Inpatient Hospital Stay: Payer: No Typology Code available for payment source

## 2022-02-25 ENCOUNTER — Other Ambulatory Visit: Payer: Self-pay | Admitting: Surgery

## 2022-02-25 ENCOUNTER — Inpatient Hospital Stay: Payer: No Typology Code available for payment source | Admitting: Emergency Medicine

## 2022-02-25 ENCOUNTER — Ambulatory Visit (HOSPITAL_COMMUNITY): Payer: No Typology Code available for payment source

## 2022-02-25 ENCOUNTER — Encounter (HOSPITAL_COMMUNITY): Payer: Self-pay

## 2022-02-25 ENCOUNTER — Encounter: Payer: Self-pay | Admitting: Physical Therapy

## 2022-02-25 ENCOUNTER — Other Ambulatory Visit: Payer: Self-pay

## 2022-02-25 DIAGNOSIS — Z853 Personal history of malignant neoplasm of breast: Secondary | ICD-10-CM

## 2022-02-25 DIAGNOSIS — C50411 Malignant neoplasm of upper-outer quadrant of right female breast: Secondary | ICD-10-CM

## 2022-02-25 LAB — RESEARCH LABS

## 2022-02-25 NOTE — Research (Signed)
[employer] 2021-05 - Specimen Collection Study to Evaluate Biomarkers in Subjects with Cancer  ? ?02/25/22 ? ?Informed Consent:  ?Patient Grace Harvey was identified by Dr. Lindi Adie as a potential candidate for the above listed study.  This Clinical Research Coordinator met with Grace Harvey, Grace Harvey on 02/25/22 in a manner and location that ensures patient privacy to discuss participation in the above listed research study.  Patient is Unaccompanied.  Patient was previously provided with informed consent documents.  Patient confirmed they have read the informed consent documents. ? ?As outlined in the informed consent form, this Coordinator and Grace Harvey discussed the purpose of the research study, the investigational nature of the study, study procedures and requirements for study participation, potential risks and benefits of study participation, as well as alternatives to participation.  This study is not blinded or double-blinded. The patient understands participation is voluntary and they may withdraw from study participation at any time.  This study does not involve randomization.  This study does not involve an investigational drug or device. This study does not involve a placebo. Patient understands enrollment is pending full eligibility review.  ? ?Confidentiality and how the patient's information will be used as part of study participation were discussed.  Patient was informed there is reimbursement provided for their time and effort spent on trial participation.  The patient is encouraged to discuss research study participation with their insurance provider to determine what costs they may incur as part of study participation, including research related injury.   ? ?All questions were answered to patient's satisfaction.  The informed consent with embedded HIPAA language was reviewed page by page.  The patient's mental and emotional status is appropriate to provide informed consent, and  the patient verbalizes an understanding of study participation.  Patient has agreed to participate in the above listed research study and has voluntarily signed the informed consent version revised 11 Nov 2021 with embedded HIPAA language, version revised 11 Nov 2021  on 02/25/22 at 11:36AM.  The patient was provided with a copy of the signed informed consent form with embedded HIPAA language for their reference.  No study specific procedures were obtained prior to the signing of the informed consent document.  Approximately 10 minutes were spent with the patient reviewing the informed consent documents.  Patient was not requested to complete a Release of Information form. ? ?Eligibility:  ?This Coordinator has reviewed this patient's inclusion and exclusion criteria and confirmed Grace Harvey is eligible for study participation.  Patient will continue with enrollment. ? ?Eligibility confirmed by treating investigator, who also agrees that patient should proceed with enrollment. ? ?Blood Collection: Research blood obtained by fresh venipuncture  per patient's preference. Patient tolerated well without any adverse events. ? ?Gift Card: $50 gift card given to patient for her participation in this study.  ? ?Data Collection:  ?Medical History:  ?High Blood Pressure  No ?Coronary Artery Disease No ?Lupus    No ?Rheumatoid Arthritis  No ?Diabetes   No      ?Lynch Syndrome  No ? ?Is the patient currently taking a magnesium supplement?   No ? ?Does the patient have a personal history of cancer (greater than 5 years ago)?  No ? ?Does the patient have a family history of cancer in 1st or 2nd degree relatives? Yes ?If yes, Relationship(s) and Cancer type(s)? The patient has history of beast cancer in her aunt, and prostate cancer in her father. ? ?Does the patient have  history of alcohol consumption? Yes   ?If yes, current or former? Current ?Number of years? 18 years , starting at age 54. ?Drinks per week? 0.5  ? ?Does the  patient have history of cigarette, cigar, pipe, or chewing tobacco use?  No  ? ? ?Grace Harvey ?Clinical Research Coordinator I  ?02/25/22 11:43 AM  ?

## 2022-02-25 NOTE — Research (Signed)
[employer] 2021-05 - Specimen Collection Study to Evaluate Biomarkers in Subjects with Cancer  ? ? ?This Nurse has reviewed this patient's inclusion and exclusion criteria as a second review and confirms Grace Harvey is eligible for study participation.  Patient may continue with enrollment. ? ?Marjie Skiff. Alberta Cairns, RN, BSN, CHPN ?She  Her  Hers ?Clinical Research Nurse ?[place] [place] ?Direct Dial [phone]  Pager [phone] ?02/25/2022 11:41 AM ?

## 2022-02-26 ENCOUNTER — Telehealth: Payer: Self-pay | Admitting: *Deleted

## 2022-02-26 NOTE — Telephone Encounter (Signed)
Call received from [facility], [place], cm with [facility] inquiring about pt's future appointment schedule. Advised no future appointments scheduled at this time with [facility] ?

## 2022-02-27 ENCOUNTER — Telehealth: Payer: Self-pay | Admitting: Hematology and Oncology

## 2022-02-27 ENCOUNTER — Encounter: Payer: Self-pay | Admitting: *Deleted

## 2022-02-27 ENCOUNTER — Telehealth: Payer: Self-pay | Admitting: Plastic Surgery

## 2022-02-27 ENCOUNTER — Telehealth: Payer: Self-pay | Admitting: *Deleted

## 2022-02-27 NOTE — Telephone Encounter (Signed)
.  Called patient to schedule appointment per 5/16 inbasket, patient is aware of date and time.   ?

## 2022-02-27 NOTE — Telephone Encounter (Signed)
Spoke with patient to follow up from [facility] 5/10 and assess navigation needs. Patient denies any questions or concerns at this time. She stated that her MRI got r/s to 5/23 due to staffing. Encouraged her to call should anything arise. Patient verbalized understanding.

## 2022-02-27 NOTE — Telephone Encounter (Signed)
LVM To schedule pre and post operative appts for surgery scheduled with Dr. Claudia Desanctis on 04/04/22

## 2022-03-03 ENCOUNTER — Encounter: Payer: Self-pay | Admitting: Genetic Counselor

## 2022-03-03 ENCOUNTER — Telehealth: Payer: Self-pay | Admitting: Genetic Counselor

## 2022-03-03 DIAGNOSIS — Z1379 Encounter for other screening for genetic and chromosomal anomalies: Secondary | ICD-10-CM | POA: Insufficient documentation

## 2022-03-03 NOTE — Telephone Encounter (Signed)
Contacted patient in attempt to disclose results of genetic testing.  LVM with contact information requesting a call back.  

## 2022-03-04 ENCOUNTER — Ambulatory Visit (HOSPITAL_COMMUNITY)
Admission: RE | Admit: 2022-03-04 | Discharge: 2022-03-04 | Disposition: A | Discharge status: Home / Self Care | Payer: No Typology Code available for payment source | Source: Ambulatory Visit | Attending: Hematology and Oncology | Admitting: Hematology and Oncology

## 2022-03-04 DIAGNOSIS — C50411 Malignant neoplasm of upper-outer quadrant of right female breast: Secondary | ICD-10-CM | POA: Insufficient documentation

## 2022-03-04 DIAGNOSIS — Z170 Estrogen receptor positive status [ER+]: Secondary | ICD-10-CM | POA: Insufficient documentation

## 2022-03-04 MED ORDER — GADOBUTROL 1 MMOL/ML IV SOLN
8.0000 mL | Freq: Once | INTRAVENOUS | Status: AC | PRN
  Administered 2022-03-04: 8 mL via INTRAVENOUS

## 2022-03-05 ENCOUNTER — Encounter: Payer: Self-pay | Admitting: *Deleted

## 2022-03-05 ENCOUNTER — Ambulatory Visit: Payer: Self-pay | Admitting: Genetic Counselor

## 2022-03-05 DIAGNOSIS — Z8042 Family history of malignant neoplasm of prostate: Secondary | ICD-10-CM

## 2022-03-05 DIAGNOSIS — Z1379 Encounter for other screening for genetic and chromosomal anomalies: Secondary | ICD-10-CM

## 2022-03-05 DIAGNOSIS — Z803 Family history of malignant neoplasm of breast: Secondary | ICD-10-CM

## 2022-03-05 DIAGNOSIS — C50411 Malignant neoplasm of upper-outer quadrant of right female breast: Secondary | ICD-10-CM

## 2022-03-05 NOTE — Telephone Encounter (Signed)
Revealed negative genetic testing and VUS in CHEK2.  Discussed that we do not know why she has breast cancer or why there is cancer in the family. It could be sporadic/famillial, due to a change in a gene that she did not inherit, due to a different gene that we are not testing, or maybe our current technology may not be able to pick something up.  It will be important for her to keep in contact with genetics to keep up with whether additional testing may be needed.

## 2022-03-05 NOTE — Progress Notes (Signed)
HPI:   Ms. Sheahan was previously seen in the [place] clinic due to a personal and family history of cancer and concerns regarding a hereditary predisposition to cancer. Please refer to our prior cancer genetics clinic note for more information regarding our discussion, assessment and recommendations, at the time. Ms. Newhard recent genetic test results were disclosed to her, as were recommendations warranted by these results. These results and recommendations are discussed in more detail below.  CANCER HISTORY:  Oncology History  Malignant neoplasm of upper-outer quadrant of right breast in female, estrogen receptor positive ([place])  02/10/2022 Initial Diagnosis   Palpable right breast mass UOQ spiculated mass at 10 o'clock position 1.8 cm, axilla negative, ultrasound-guided biopsy: Grade 2 IDC, ER 100%, PR 100%, HER2 equivocal by IHC, FISH negative, Ki-67 30%   02/19/2022 Cancer Staging   Staging form: Breast, AJCC 8th Edition - Clinical: Stage IA (cT1c, cN0, cM0, G2, ER+, PR+, HER2-) - Signed by Nicholas Lose, MD on 02/19/2022 Stage prefix: Initial diagnosis Histologic grading system: 3 grade system    02/28/2022 Genetic Testing   Negative hereditary cancer genetic testing: no pathogenic variants detected in [facility].  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  Report dates are 02/28/2022 and Mar 04, 2022.   The CustomNext-Cancer+RNAinsight panel offered by [facility] includes sequencing and rearrangement analysis for the following 47 genes:  APC, ATM, AXIN2, BARD1, BMPR1A, BRCA1, BRCA2, BRIP1, CDH1, CDK4, CDKN2A, CHEK2, DICER1, EPCAM, GREM1, HOXB13, MEN1, MLH1, MSH2, MSH3, MSH6, MUTYH, NBN, NF1, NF2, NTHL1, PALB2, PMS2, POLD1, POLE, PTEN, RAD51C, RAD51D, RECQL, RET, SDHA, SDHAF2, SDHB, SDHC, SDHD, SMAD4, SMARCA4, STK11, TP53, TSC1, TSC2, and VHL.  RNA data is routinely analyzed for use in variant interpretation for all genes.      FAMILY HISTORY:  We obtained a detailed, 4-generation family history.  Significant diagnoses are listed below: Family History  Problem Relation Age of Onset   Hypertension Father    Prostate cancer Father 84   Breast cancer Maternal Aunt 87   Breast cancer Other        MGM's sister; dx 45s     Ms. Vandruff is unaware of previous family history of genetic testing for hereditary cancer risks. There is no reported Ashkenazi Jewish ancestry. There is no known consanguinity.  GENETIC TEST RESULTS:  The [facility] Custom[employer]-Cancer +RNAinsight Panel found no pathogenic mutations.  The CustomNext-Cancer+RNAinsight panel offered by [facility] includes sequencing and rearrangement analysis for the following 47 genes:  APC, ATM, AXIN2, BARD1, BMPR1A, BRCA1, BRCA2, BRIP1, CDH1, CDK4, CDKN2A, CHEK2, DICER1, EPCAM, GREM1, HOXB13, MEN1, MLH1, MSH2, MSH3, MSH6, MUTYH, NBN, NF1, NF2, NTHL1, PALB2, PMS2, POLD1, POLE, PTEN, RAD51C, RAD51D, RECQL, RET, SDHA, SDHAF2, SDHB, SDHC, SDHD, SMAD4, SMARCA4, STK11, TP53, TSC1, TSC2, and VHL.  RNA data is routinely analyzed for use in variant interpretation for all genes. .   The test report has been scanned into EPIC and is located under the Molecular Pathology section of the Results Review tab.  A portion of the result report is included below for reference. Genetic testing reported out on Mar 04, 2022.     Genetic testing identified a variant of uncertain significance (VUS) in the CHEK2 gene called c.910A>T (p.M304L).  At this time, it is unknown if this variant is associated with an increased risk for cancer or if it is benign, but most uncertain variants are reclassified to benign. It should not be used to make medical management decisions. With time,  we suspect the laboratory will determine the significance of this variant, if any. If the laboratory reclassifies this variant, we will attempt to contact Ms. Hesler to discuss it further.   Even though a pathogenic  variant was not identified, possible explanations for the cancer in the family may include: There may be no hereditary risk for cancer in the family. The cancers in Ms. Schnepf and/or her family may be sporadic/familial or due to other genetic and environmental factors. There may be a gene mutation in one of these genes that current testing methods cannot detect but that chance is small. There could be another gene that has not yet been discovered, or that we have not yet tested, that is responsible for the cancer diagnoses in the family.  It is also possible there is a hereditary cause for the cancer in the family that Ms. Delcid did not inherit. The variant of uncertain significance detected in the CHEK2 gene may be reclassified as a pathogenic variant in the future. At this time, we do not know if this variant increases the risk for cancer.  Therefore, it is important to remain in touch with cancer genetics in the future so that we can continue to offer Ms. Bacigalupo the most up to date genetic testing.    ADDITIONAL GENETIC TESTING:  We discussed with Ms. Stapleton that her genetic testing was fairly extensive.  If there are additional relevant genes identified to increase cancer risk that can be analyzed in the future, we would be happy to discuss and coordinate this testing at that time.     CANCER SCREENING RECOMMENDATIONS:  Ms. Boehne test result is considered negative (normal).  This means that we have not identified a hereditary cause for her personal history of breast cancer at this time.   An individual's cancer risk and medical management are not determined by genetic test results alone. Overall cancer risk assessment incorporates additional factors, including personal medical history, family history, and any available genetic information that may result in a personalized plan for cancer prevention and surveillance. Therefore, it is recommended she continue to follow the cancer management and  screening guidelines provided by her oncology and primary healthcare provider.  RECOMMENDATIONS FOR FAMILY MEMBERS:   Since she did not inherit a identifiable mutation in a cancer predisposition gene included on this panel, her children could not have inherited a known mutation from her in one of these genes. Individuals in this family might be at some increased risk of developing cancer, over the general population risk, due to the family history of cancer.  Individuals in the family should notify their providers of the family history of cancer. We recommend women in this family have a yearly mammogram beginning at age 60, or 16 years younger than the earliest onset of cancer, an annual clinical breast exam, and perform monthly breast self-exams.   Other members of the family may still carry a pathogenic variant in one of these genes that Ms. Monterrosa did not inherit. Based on the family history, her maternal aunt who had breast cancer is a candidate for genetic counseling/testing. Ms. Taplin can let us know if we can be of any assistance in coordinating genetic counseling and/or testing for this family member.   We do not recommend familial testing for the CHEK2 variant of uncertain significance (VUS).  FOLLOW-UP:  Lastly, we discussed with Ms. Kerins that cancer genetics is a rapidly advancing field and it is possible that new genetic tests will be appropriate  for her and/or her family members in the future. We encouraged her to remain in contact with cancer genetics so we can update her personal and family histories and let her know of advances in cancer genetics that may benefit this family.   Our contact number was provided. Ms. Lezotte questions were answered to her satisfaction, and she knows she is welcome to call us at anytime with additional questions or concerns.   Desira Alessandrini M. Joette Catching, [place], [facility] Genetic Counselor [email or web address] (P) [phone]

## 2022-03-06 ENCOUNTER — Encounter: Payer: Self-pay | Admitting: *Deleted

## 2022-03-18 NOTE — Progress Notes (Unsigned)
Patient ID: Grace Harvey, female    DOB: 1981/08/22, 41 y.o.   MRN: [id number]  No chief complaint on file.   No diagnosis found.   History of Present Illness: Grace Harvey is a 41 y.o.  female  with a history of right breast cancer.  She presents for preoperative evaluation for upcoming procedure, right breast oncoplastic reduction and left breast reduction for symmetry scheduled for 04/04/2022 with Dr.  Claudia Harvey after right breast lumpectomy with radioactive seed and sentinel lymph node biopsy by Dr. Ninfa Harvey on 04/04/2022.  The patient {HAS HAS OHY:[id number]} had problems with anesthesia. ***  Summary of Previous Visit: ***  Estimated excess breast tissue to be removed at time of surgery: *** grams  Job: ***  PMH Significant for: ***  Photo***   Past Medical History: Allergies: No Known Allergies  Current Medications:  Current Outpatient Medications:    sertraline (ZOLOFT) 50 MG tablet, TAke 1 &1/2 tablets by mouth Once a day, Disp: 135 tablet, Rfl: 0  Past Medical Problems: Past Medical History:  Diagnosis Date   Cervical intraepithelial neoplasm HIGH GRADE   Family history of breast cancer 02/19/2022   Family history of prostate cancer 02/19/2022    Past Surgical History: Past Surgical History:  Procedure Laterality Date   INTRAUTERINE DEVICE (IUD) INSERTION     mirena iud insertion 12-23-19   IUD REMOVAL  03/02/2012   MIRENA   laser vaporization of cervix  10/14/2011   RESET FX RIGHT ARM  AGE 34   WISDOM TOOTH EXTRACTION  03/04/2012   ORAL SURGEON OFFICE    Social History: Social History   Socioeconomic History   Marital status: Married    Spouse name: Not on file   Number of children: Not on file   Years of education: Not on file   Highest education level: Not on file  Occupational History   Not on file  Tobacco Use   Smoking status: Never   Smokeless tobacco: Never  Vaping Use   Vaping Use: Never used  Substance and Sexual Activity   Alcohol  use: No    Alcohol/week: 0.0 standard drinks   Drug use: No   Sexual activity: Yes    Partners: Male    Birth control/protection: I.U.D.  Other Topics Concern   Not on file  Social History Narrative   Not on file   Social Determinants of Health   Financial Resource Strain: Not on file  Food Insecurity: Not on file  Transportation Needs: Not on file  Physical Activity: Not on file  Stress: Not on file  Social Connections: Not on file  Intimate Partner Violence: Not on file    Family History: Family History  Problem Relation Age of Onset   Hypertension Father    Prostate cancer Father 25   Breast cancer Maternal Aunt 15   Breast cancer Other        MGM's sister; dx 13s    Review of Systems: ROS  Physical Exam: Vital Signs There were no vitals taken for this visit.  Physical Exam *** Constitutional:      General: Not in acute distress.    Appearance: Normal appearance. Not ill-appearing.  HENT:     Head: Normocephalic and atraumatic.  Eyes:     Pupils: Pupils are equal, round Neck:     Musculoskeletal: Normal range of motion.  Cardiovascular:     Rate and Rhythm: Normal rate    Pulses: Normal pulses.  Pulmonary:  Effort: Pulmonary effort is normal. No respiratory distress.  Musculoskeletal: Normal range of motion.  Skin:    General: Skin is warm and dry.     Findings: No erythema or rash.  Neurological:     General: No focal deficit present.     Mental Status: Alert and oriented to person, place, and time. Mental status is at baseline.     Motor: No weakness.  Psychiatric:        Mood and Affect: Mood normal.        Behavior: Behavior normal.    Assessment/Plan: The patient is scheduled for bilateral breast reduction with Grace Harvey.  Risks, benefits, and alternatives of procedure discussed, questions answered and consent obtained.    Smoking Status: ***; Counseling Given? *** Last Mammogram: ***; Results:  ***  Caprini Score: ***; Risk Factors include: ***, BMI *** 25, and length of planned surgery. Recommendation for mechanical *** pharmacological prophylaxis. Encourage early ambulation.   Pictures obtained: '@consult'$ ***  Post-op Rx sent to pharmacy: {Blank:[id number]::"Oxycodone, Zofran, Keflex","Oxycodone, Zofran"}  Patient was provided with the breast reduction and General Surgical Risk consent document and Pain Medication Agreement prior to their appointment.  They had adequate time to read through the risk consent documents and Pain Medication Agreement. We also discussed them in person together during this preop appointment. All of their questions were answered to their satisfaction.  Recommended calling if they have any further questions.  Risk consent form and Pain Medication Agreement to be scanned into patient's chart.  The risk that can be encountered with breast reduction were discussed and include the following but not limited to these:  Breast asymmetry, fluid accumulation, firmness of the breast, inability to breast feed, loss of nipple or areola, skin loss, decrease or no nipple sensation, fat necrosis of the breast tissue, bleeding, infection, healing delay.  There are risks of anesthesia, changes to skin sensation and injury to nerves or blood vessels.  The muscle can be temporarily or permanently injured.  You may have an allergic reaction to tape, suture, glue, blood products which can result in skin discoloration, swelling, pain, skin lesions, poor healing.  Any of these can lead to the need for revisonal surgery or stage procedures.  A reduction has potential to interfere with diagnostic procedures.  Nipple or breast piercing can increase risks of infection.  This procedure is best done when the breast is fully developed.  Changes in the breast will continue to occur over time.  Pregnancy can alter the outcomes of previous breast reduction surgery, weight gain and weigh loss can also effect  the long term appearance.     Electronically signed by: Grace Rhine Zaydn Gutridge, PA-C 03/18/2022 12:33 PM

## 2022-03-19 ENCOUNTER — Ambulatory Visit (INDEPENDENT_AMBULATORY_CARE_PROVIDER_SITE_OTHER): Payer: No Typology Code available for payment source | Admitting: Physician Assistant

## 2022-03-19 ENCOUNTER — Other Ambulatory Visit (HOSPITAL_COMMUNITY): Payer: Self-pay

## 2022-03-19 ENCOUNTER — Encounter: Payer: Self-pay | Admitting: Physician Assistant

## 2022-03-19 VITALS — BP 114/81 | HR 83 | Ht 64.0 in | Wt 184.0 lb

## 2022-03-19 DIAGNOSIS — C50411 Malignant neoplasm of upper-outer quadrant of right female breast: Secondary | ICD-10-CM

## 2022-03-19 DIAGNOSIS — Z170 Estrogen receptor positive status [ER+]: Secondary | ICD-10-CM

## 2022-03-19 MED ORDER — OXYCODONE HCL 5 MG PO TABS
5.0000 mg | ORAL_TABLET | Freq: Four times a day (QID) | ORAL | 0 refills | Status: AC | PRN
  Filled 2022-03-19: qty 20, 5d supply, fill #0

## 2022-03-19 MED ORDER — ONDANSETRON 4 MG PO TBDP
4.0000 mg | ORAL_TABLET | Freq: Three times a day (TID) | ORAL | 0 refills | Status: DC | PRN
  Filled 2022-03-19: qty 20, 7d supply, fill #0

## 2022-03-19 NOTE — Progress Notes (Signed)
Patient ID: Grace Harvey, female    DOB: 1980-12-04, 41 y.o.   MRN: [id number]  Chief Complaint  Patient presents with   Pre-op Exam      ICD-10-CM   1. Malignant neoplasm of upper-outer quadrant of right breast in female, estrogen receptor positive ([place])  C50.411    Z17.0        History of Present Illness: Grace Harvey is a 41 y.o.  female  with a history of right-sided breast cancer.  She presents for preoperative evaluation for upcoming procedure, bilateral oncoplastic reduction at time of right breast lumpectomy with radioactive seed and sentinel lymph node biopsy, scheduled for 04/04/2022 with Dr. Claudia Desanctis and Dr. Ninfa Linden.  The patient has not had problems with anesthesia.  Patient reports that she is a 60 F cup.  She would like to be a C or D cup postoperatively.  Denies any history of keloiding or allergies to tape/latex.  No personal history of CVA, MI, or requirement for blood thinners.  No personal or family history of blood clots or clotting disorder.  The only medication that she takes regularly is her sertraline which she can continue throughout perioperative period.  Summary of Previous Visit: Patient was seen for consult by Dr. Claudia Desanctis on 02/20/2022.  At that time, she had recently been diagnosed with right-sided breast cancer and had been seen by Dr. Ninfa Linden with regard to lumpectomy with radioactive seed and sentinel lymph node biopsy.  STN 30 cm bilaterally.  She is slightly larger on the right.  She expressed interest in oncoplastic breast reduction at time of lumpectomy.  The orientation and location of scars were explained to patient.  Discussed how her likely need for radiation postoperatively could alter the breast shape in the future.  She expressed understanding and was agreeable to the plan.  Job: Owns and operates a Multimedia programmer.  PMH Significant for: Malignant neoplasm of upper-outer quadrant right breast, anxiety.   Past Medical  History: Allergies: No Known Allergies  Current Medications:  Current Outpatient Medications:    sertraline (ZOLOFT) 50 MG tablet, TAke 1 &1/2 tablets by mouth Once a day, Disp: 135 tablet, Rfl: 0  Past Medical Problems: Past Medical History:  Diagnosis Date   Cervical intraepithelial neoplasm HIGH GRADE   Family history of breast cancer 02/19/2022   Family history of prostate cancer 02/19/2022    Past Surgical History: Past Surgical History:  Procedure Laterality Date   INTRAUTERINE DEVICE (IUD) INSERTION     mirena iud insertion 12-23-19   IUD REMOVAL  03/02/2012   MIRENA   laser vaporization of cervix  10/14/2011   RESET FX RIGHT ARM  AGE 18   WISDOM TOOTH EXTRACTION  03/04/2012   [facility]AL SURGEON OFFICE    Social History: Social History   Socioeconomic History   Marital status: Married    Spouse name: Not on file   Number of children: Not on file   Years of education: Not on file   Highest education level: Not on file  Occupational History   Not on file  Tobacco Use   Smoking status: Never   Smokeless tobacco: Never  Vaping Use   Vaping Use: Never used  Substance and Sexual Activity   Alcohol use: No    Alcohol/week: 0.0 standard drinks   Drug use: No   Sexual activity: Yes    Partners: Male    Birth control/protection: I.U.D.  Other Topics Concern   Not on file  Social History Narrative   Not on file   Social Determinants of Health   Financial Resource Strain: Not on file  Food Insecurity: Not on file  Transportation Needs: Not on file  Physical Activity: Not on file  Stress: Not on file  Social Connections: Not on file  Intimate Partner Violence: Not on file    Family History: Family History  Problem Relation Age of Onset   Hypertension Father    Prostate cancer Father 44   Breast cancer Maternal Aunt 55   Breast cancer Other        MGM's sister; dx 39s    Review of Systems: ROS Denies any recent chest pain, difficulty breathing, leg  swelling, fevers, infections, or traumas.  Physical Exam: Vital Signs There were no vitals taken for this visit.  Physical Exam Constitutional:      General: Not in acute distress.    Appearance: Normal appearance. Not ill-appearing.  HENT:     Head: Normocephalic and atraumatic.  Eyes:     Pupils: Pupils are equal, round. Cardiovascular:     Rate and Rhythm: Normal rate.    Pulses: Normal pulses.  Pulmonary:     Effort: No respiratory distress or increased work of breathing.  Speaks in full sentences. Abdominal:     General: Abdomen is flat. No distension.   Musculoskeletal: Normal range of motion. No lower extremity swelling or edema. No varicosities. Skin:    General: Skin is warm and dry.     Findings: No erythema or rash.  Neurological:     Mental Status: Alert and oriented to person, place, and time.  Psychiatric:        Mood and Affect: Mood normal.        Behavior: Behavior normal.    Assessment/Plan: The patient is scheduled for bilateral oncoplastic reduction with Dr. Claudia Desanctis.  Risks, benefits, and alternatives of procedure discussed, questions answered and consent obtained.    Smoking Status: Non-smoker. Last Mammogram: 02/2022; Results: BI-RADS Category 6: Known biopsy-proven malignancy right breast and 10 o'clock position.  No evidence of malignancy anywhere else in either breast.  Caprini Score: 5; Risk Factors include: BMI greater than 25, breast cancer, and length of planned surgery. Recommendation for mechanical prophylaxis. Encourage early ambulation.   Pictures obtained: Today.  Post-op Rx sent to pharmacy: Oxycodone and Zofran.  Patient was provided with the General Surgical Risk consent document and Pain Medication Agreement prior to their appointment.  They had adequate time to read through the risk consent documents and Pain Medication Agreement. We also discussed them in person together during this preop appointment. All of their questions were answered  to their satisfaction.  Recommended calling if they have any further questions.  Risk consent form and Pain Medication Agreement to be scanned into patient's chart.  The risk that can be encountered with breast reduction were discussed and include the following but not limited to these:  Breast asymmetry, fluid accumulation, firmness of the breast, inability to breast feed, loss of nipple or areola, skin loss, decrease or no nipple sensation, fat necrosis of the breast tissue, bleeding, infection, healing delay.  There are risks of anesthesia, changes to skin sensation and injury to nerves or blood vessels.  The muscle can be temporarily or permanently injured.  You may have an allergic reaction to tape, suture, glue, blood products which can result in skin discoloration, swelling, pain, skin lesions, poor healing.  Any of these can lead to the need for revisonal surgery  or stage procedures.  A reduction has potential to interfere with diagnostic procedures.  Nipple or breast piercing can increase risks of infection.  This procedure is best done when the breast is fully developed.  Changes in the breast will continue to occur over time.  Pregnancy can alter the outcomes of previous breast reduction surgery, weight gain and weigh loss can also effect the long term appearance.     Electronically signed by: Krista Blue, PA-C 03/19/2022 9:12 AM

## 2022-03-26 ENCOUNTER — Other Ambulatory Visit: Payer: Self-pay

## 2022-03-26 ENCOUNTER — Encounter (HOSPITAL_BASED_OUTPATIENT_CLINIC_OR_DEPARTMENT_OTHER): Payer: Self-pay | Admitting: Surgery

## 2022-04-01 ENCOUNTER — Other Ambulatory Visit (HOSPITAL_COMMUNITY): Payer: Self-pay

## 2022-04-01 MED ORDER — SERTRALINE HCL 50 MG PO TABS
ORAL_TABLET | ORAL | 0 refills | Status: DC
  Filled 2022-04-01: qty 135, 90d supply, fill #0

## 2022-04-01 MED ORDER — ENSURE PRE-SURGERY PO LIQD: 296.0000 mL | Freq: Once | ORAL | Status: DC

## 2022-04-01 NOTE — Progress Notes (Signed)

## 2022-04-03 ENCOUNTER — Ambulatory Visit
Admission: RE | Admit: 2022-04-03 | Discharge: 2022-04-03 | Disposition: A | Discharge status: Home / Self Care | Payer: No Typology Code available for payment source | Source: Ambulatory Visit | Attending: Surgery | Admitting: Surgery

## 2022-04-03 ENCOUNTER — Other Ambulatory Visit: Payer: Self-pay | Admitting: Surgery

## 2022-04-03 DIAGNOSIS — Z853 Personal history of malignant neoplasm of breast: Secondary | ICD-10-CM

## 2022-04-03 NOTE — H&P (Signed)
PROVIDER: Beverlee Nims, MD  MRN: [id number] DOB: [birth year]  Subjective   Chief Complaint: Breast Cancer   History of Present Illness: Grace Harvey is a 41 y.o. female who is seen as an office consultation for evaluation of Breast Cancer .   This is a 41 year old female who felt a palpable mass in her right breast toward the axilla .  She underwent mammograms and ultrasound showing a mass at the 10 o'clock position 8 cm from the nipple on the right side. Ultrasound of the axilla was unremarkable. There were no other abnormalities in either breast. Density was a c density.. A biopsy of the mass showed invasive ductal carcinoma which was grade 2. There was 100% ER/PR positive, HER2 negative, and had a Ki-67 of 30%. She has a family history of a maternal aunt who had breast cancer in her 60s. She is otherwise healthy without complaints. She has had no previous problems with general anesthesia.  Review of Systems: A complete review of systems was obtained from the patient. I have reviewed this information and discussed as appropriate with the patient. See HPI as well for other ROS.  ROS   Medical History: Past Medical History:  Diagnosis Date   Anxiety 02/19/2022   H/O abnormal Pap smear   H/O varicella   Patient Active Problem List  Diagnosis   Anxiety   Breast cancer (CMS-HCC)   Malignant neoplasm of upper-outer quadrant of right breast in female, estrogen receptor positive (CMS-HCC)   Past Surgical History:  Procedure Laterality Date   Broken arm Right  Unknown date    No Known Allergies  Current Outpatient Medications on File Prior to Visit  Medication Sig Dispense Refill   sertraline (ZOLOFT) 50 MG tablet Take 75 mg by mouth once daily   Take 1 &1/2 tablets by mouth once a day   PRENATAL VITS W-CA,FE,FA,<1MG, (PRENATAL VITAMIN ORAL) Take by mouth.   No current facility-administered medications on file prior to visit.   Family History  Problem Relation Age  of Onset   Prostate cancer Father   Breast cancer Maternal Aunt   Clotting disorder Neg Hx    Social History   Tobacco Use  Smoking Status Never  Smokeless Tobacco Never    Social History   Socioeconomic History   Marital status: Married  Tobacco Use   Smoking status: Never   Smokeless tobacco: Never  Vaping Use   Vaping Use: Never used  Substance and Sexual Activity   Alcohol use: Yes  Comment: very little   Drug use: No   Sexual activity: Yes  Partners: Male   Objective:  There were no vitals filed for this visit.  There is no height or weight on file to calculate BMI.  Physical Exam   She appears well on exam  I can vaguely feel the mass deep in the right breast toward the axilla. The nipple areolar complex is normal and there are no other palpable breast masses. There is no axillary adenopathy.  Labs, Imaging and Diagnostic Testing: I have reviewed her mammograms, ultrasound, pathology results.  Assessment and Plan:   Diagnoses and all orders for this visit:  Invasive ductal carcinoma of breast, female, right (CMS-HCC)    We discussed her breast cancer conference. I have discussed the diagnosis with the patient and her husband. I did have discussion with the patient and her husband regarding surgery for breast cancer. We discussed both breast conservation and mastectomy. She has very large breasts and would  like to consider oncoplastic reduction at the time of surgery. Given this, she will still require a radioactive seed guided lumpectomy and sentinel node biopsy. We discussed the reasons for  biopsy of the lymph nodes in the time of surgery as well. I discussed the surgical procedure with them in detail. We discussed the risks which includes but is not limited to bleeding, infection, injury to surrounding structures, and need for further procedures if margins or nodes are positive, cardiopulmonary issues, postoperative recovery, etc. Because of the density of  her breast, her young age, and no family history of breast cancer, an MRI has been ordered to evaluate the true size of the tumor in her breast in general. Genetics testing has been ordered. I will also refer her to plastic surgery to consider oncoplastic reduction. They understand and agree with plans.   Addendum:  MRI showed the right breast mass to measure 3 cm with a 16m and 721msatellite lesion adjacent to the mass.  No other abnormalities were seen and there was no adenopathy.  She has seen plastic surgery and the plan will be to proceed with a right breast radioactive seed guided lumpectomy and sentinel node biopsy followed by immediate oncoplastic reduction

## 2022-04-04 ENCOUNTER — Ambulatory Visit (HOSPITAL_BASED_OUTPATIENT_CLINIC_OR_DEPARTMENT_OTHER)
Admission: RE | Admit: 2022-04-04 | Discharge: 2022-04-04 | Disposition: A | Discharge status: Home / Self Care | Payer: No Typology Code available for payment source | Attending: Surgery | Admitting: Surgery

## 2022-04-04 ENCOUNTER — Ambulatory Visit
Admission: RE | Admit: 2022-04-04 | Discharge: 2022-04-04 | Disposition: A | Discharge status: Home / Self Care | Payer: No Typology Code available for payment source | Source: Ambulatory Visit | Attending: Surgery | Admitting: Surgery

## 2022-04-04 ENCOUNTER — Encounter (HOSPITAL_BASED_OUTPATIENT_CLINIC_OR_DEPARTMENT_OTHER)
Admission: RE | Disposition: A | Discharge status: Home / Self Care | Payer: Self-pay | Source: Home / Self Care | Attending: Surgery

## 2022-04-04 ENCOUNTER — Other Ambulatory Visit: Payer: Self-pay

## 2022-04-04 ENCOUNTER — Ambulatory Visit (HOSPITAL_BASED_OUTPATIENT_CLINIC_OR_DEPARTMENT_OTHER): Payer: No Typology Code available for payment source | Admitting: Certified Registered"

## 2022-04-04 ENCOUNTER — Encounter (HOSPITAL_BASED_OUTPATIENT_CLINIC_OR_DEPARTMENT_OTHER): Payer: Self-pay | Admitting: Surgery

## 2022-04-04 DIAGNOSIS — N651 Disproportion of reconstructed breast: Secondary | ICD-10-CM

## 2022-04-04 DIAGNOSIS — C50411 Malignant neoplasm of upper-outer quadrant of right female breast: Secondary | ICD-10-CM | POA: Insufficient documentation

## 2022-04-04 DIAGNOSIS — Z853 Personal history of malignant neoplasm of breast: Secondary | ICD-10-CM

## 2022-04-04 DIAGNOSIS — C50911 Malignant neoplasm of unspecified site of right female breast: Secondary | ICD-10-CM

## 2022-04-04 DIAGNOSIS — E669 Obesity, unspecified: Secondary | ICD-10-CM | POA: Insufficient documentation

## 2022-04-04 DIAGNOSIS — Z803 Family history of malignant neoplasm of breast: Secondary | ICD-10-CM | POA: Diagnosis not present

## 2022-04-04 DIAGNOSIS — Z6831 Body mass index (BMI) 31.0-31.9, adult: Secondary | ICD-10-CM | POA: Diagnosis not present

## 2022-04-04 DIAGNOSIS — Z170 Estrogen receptor positive status [ER+]: Secondary | ICD-10-CM | POA: Insufficient documentation

## 2022-04-04 DIAGNOSIS — Z01818 Encounter for other preprocedural examination: Secondary | ICD-10-CM

## 2022-04-04 HISTORY — PX: BREAST REDUCTION SURGERY: SHX8

## 2022-04-04 HISTORY — PX: BREAST LUMPECTOMY WITH RADIOACTIVE SEED AND SENTINEL LYMPH NODE BIOPSY: SHX6550

## 2022-04-04 SURGERY — BREAST LUMPECTOMY WITH RADIOACTIVE SEED AND SENTINEL LYMPH NODE BIOPSY
Anesthesia: General | Site: Breast | Laterality: Right

## 2022-04-04 MED ORDER — SUGAMMADEX SODIUM 500 MG/5ML IV SOLN
INTRAVENOUS | Status: AC
  Filled 2022-04-04: qty 5

## 2022-04-04 MED ORDER — ONDANSETRON HCL 4 MG/2ML IJ SOLN
4.0000 mg | Freq: Once | INTRAMUSCULAR | Status: DC | PRN
Start: 2022-04-04 — End: 2022-04-04

## 2022-04-04 MED ORDER — MIDAZOLAM HCL 2 MG/2ML IJ SOLN
2.0000 mg | Freq: Once | INTRAMUSCULAR | Status: AC
  Administered 2022-04-04: 2 mg via INTRAVENOUS

## 2022-04-04 MED ORDER — FENTANYL CITRATE (PF) 100 MCG/2ML IJ SOLN
INTRAMUSCULAR | Status: AC
  Filled 2022-04-04: qty 2

## 2022-04-04 MED ORDER — DEXAMETHASONE SODIUM PHOSPHATE 4 MG/ML IJ SOLN
INTRAMUSCULAR | Status: DC | PRN
  Administered 2022-04-04: 10 mg via INTRAVENOUS

## 2022-04-04 MED ORDER — ONDANSETRON HCL 4 MG/2ML IJ SOLN
INTRAMUSCULAR | Status: DC | PRN
  Administered 2022-04-04: 4 mg via INTRAVENOUS

## 2022-04-04 MED ORDER — ACETAMINOPHEN 500 MG PO TABS
ORAL_TABLET | ORAL | Status: AC
  Filled 2022-04-04: qty 2

## 2022-04-04 MED ORDER — ONDANSETRON HCL 4 MG/2ML IJ SOLN
INTRAMUSCULAR | Status: AC
  Filled 2022-04-04: qty 2

## 2022-04-04 MED ORDER — FENTANYL CITRATE (PF) 100 MCG/2ML IJ SOLN
100.0000 ug | Freq: Once | INTRAMUSCULAR | Status: AC
  Administered 2022-04-04: 50 ug via INTRAVENOUS

## 2022-04-04 MED ORDER — CEFAZOLIN SODIUM-DEXTROSE 1-4 GM/50ML-% IV SOLN
INTRAVENOUS | Status: DC | PRN
  Administered 2022-04-04 (×2): 2 g via INTRAVENOUS

## 2022-04-04 MED ORDER — PROPOFOL 10 MG/ML IV BOLUS
INTRAVENOUS | Status: AC
  Filled 2022-04-04: qty 20

## 2022-04-04 MED ORDER — LACTATED RINGERS IV SOLN: INTRAVENOUS | Status: DC

## 2022-04-04 MED ORDER — MIDAZOLAM HCL 2 MG/2ML IJ SOLN
INTRAMUSCULAR | Status: AC
  Filled 2022-04-04: qty 2

## 2022-04-04 MED ORDER — PROPOFOL 10 MG/ML IV BOLUS
INTRAVENOUS | Status: DC | PRN
  Administered 2022-04-04: 170 mg via INTRAVENOUS

## 2022-04-04 MED ORDER — OXYCODONE HCL 5 MG PO TABS
ORAL_TABLET | ORAL | Status: AC
  Filled 2022-04-04: qty 1

## 2022-04-04 MED ORDER — LACTATED RINGERS IV SOLN
INTRAVENOUS | Status: DC | PRN
  Administered 2022-04-04: 700 mL
  Administered 2022-04-04: 500 mL

## 2022-04-04 MED ORDER — LIDOCAINE HCL (CARDIAC) PF 100 MG/5ML IV SOSY
PREFILLED_SYRINGE | INTRAVENOUS | Status: DC | PRN
  Administered 2022-04-04: 80 mg via INTRAVENOUS

## 2022-04-04 MED ORDER — 0.9 % SODIUM CHLORIDE (POUR BTL) OPTIME
TOPICAL | Status: DC | PRN
  Administered 2022-04-04: 100 mL

## 2022-04-04 MED ORDER — CHLORHEXIDINE GLUCONATE CLOTH 2 % EX PADS: 6.0000 | MEDICATED_PAD | Freq: Once | CUTANEOUS | Status: DC

## 2022-04-04 MED ORDER — ACETAMINOPHEN 500 MG PO TABS
1000.0000 mg | ORAL_TABLET | ORAL | Status: AC
  Administered 2022-04-04: 1000 mg via ORAL

## 2022-04-04 MED ORDER — SCOPOLAMINE 1 MG/3DAYS TD PT72
1.0000 | MEDICATED_PATCH | Freq: Once | TRANSDERMAL | Status: DC
  Administered 2022-04-04: 1.5 mg via TRANSDERMAL

## 2022-04-04 MED ORDER — CHLORHEXIDINE GLUCONATE CLOTH 2 % EX PADS
6.0000 | MEDICATED_PAD | Freq: Once | CUTANEOUS | Status: DC
Start: 2022-04-04 — End: 2022-04-04

## 2022-04-04 MED ORDER — EPINEPHRINE PF 1 MG/ML IJ SOLN
INTRAMUSCULAR | Status: AC
  Filled 2022-04-04: qty 2

## 2022-04-04 MED ORDER — OXYCODONE HCL 5 MG PO TABS
5.0000 mg | ORAL_TABLET | Freq: Once | ORAL | Status: AC
  Administered 2022-04-04: 5 mg via ORAL

## 2022-04-04 MED ORDER — FENTANYL CITRATE (PF) 100 MCG/2ML IJ SOLN
25.0000 ug | INTRAMUSCULAR | Status: DC | PRN
  Administered 2022-04-04 (×2): 50 ug via INTRAVENOUS

## 2022-04-04 MED ORDER — SCOPOLAMINE 1 MG/3DAYS TD PT72
MEDICATED_PATCH | TRANSDERMAL | Status: AC
  Filled 2022-04-04: qty 1

## 2022-04-04 MED ORDER — FENTANYL CITRATE (PF) 100 MCG/2ML IJ SOLN
INTRAMUSCULAR | Status: DC | PRN
  Administered 2022-04-04 (×2): 25 ug via INTRAVENOUS
  Administered 2022-04-04: 100 ug via INTRAVENOUS
  Administered 2022-04-04 (×2): 25 ug via INTRAVENOUS
  Administered 2022-04-04 (×2): 50 ug via INTRAVENOUS

## 2022-04-04 MED ORDER — CEFAZOLIN SODIUM-DEXTROSE 2-4 GM/100ML-% IV SOLN
INTRAVENOUS | Status: AC
Start: 2022-04-04 — End: ?
  Filled 2022-04-04: qty 100

## 2022-04-04 MED ORDER — CEFAZOLIN SODIUM-DEXTROSE 2-4 GM/100ML-% IV SOLN: 2.0000 g | INTRAVENOUS | Status: DC

## 2022-04-04 MED ORDER — DEXAMETHASONE SODIUM PHOSPHATE 10 MG/ML IJ SOLN
INTRAMUSCULAR | Status: AC
Start: 2022-04-04 — End: ?
  Filled 2022-04-04: qty 1

## 2022-04-04 MED ORDER — BUPIVACAINE HCL (PF) 0.25 % IJ SOLN
INTRAMUSCULAR | Status: AC
  Filled 2022-04-04: qty 60

## 2022-04-04 MED ORDER — MAGTRACE LYMPHATIC TRACER
INTRAMUSCULAR | Status: DC | PRN
  Administered 2022-04-04: 2 mL via INTRAMUSCULAR

## 2022-04-04 MED ORDER — ROCURONIUM BROMIDE 100 MG/10ML IV SOLN
INTRAVENOUS | Status: DC | PRN
  Administered 2022-04-04: 20 mg via INTRAVENOUS
  Administered 2022-04-04: 60 mg via INTRAVENOUS

## 2022-04-04 MED ORDER — BUPIVACAINE-EPINEPHRINE (PF) 0.5% -1:200000 IJ SOLN
INTRAMUSCULAR | Status: DC | PRN
  Administered 2022-04-04: 30 mL via PERINEURAL

## 2022-04-04 MED ORDER — LIDOCAINE 2% (20 MG/ML) 5 ML SYRINGE
INTRAMUSCULAR | Status: AC
  Filled 2022-04-04: qty 5

## 2022-04-04 MED ORDER — SUGAMMADEX SODIUM 500 MG/5ML IV SOLN
INTRAVENOUS | Status: DC | PRN
  Administered 2022-04-04: 240 mg via INTRAVENOUS

## 2022-04-04 MED ORDER — DEXAMETHASONE SODIUM PHOSPHATE 10 MG/ML IJ SOLN
INTRAMUSCULAR | Status: DC | PRN
  Administered 2022-04-04: 10 mg

## 2022-04-04 SURGICAL SUPPLY — 81 items
ADH SKN CLS APL DERMABOND .7 (GAUZE/BANDAGES/DRESSINGS)
APL PRP STRL LF DISP 70% ISPRP (MISCELLANEOUS) ×4
APL SKNCLS STERI-STRIP NONHPOA (GAUZE/BANDAGES/DRESSINGS) ×4
APPLIER CLIP 9.375 MED OPEN (MISCELLANEOUS) ×3
APR CLP MED 9.3 20 MLT OPN (MISCELLANEOUS) ×2
BENZOIN TINCTURE PRP APPL 2/3 (GAUZE/BANDAGES/DRESSINGS) ×6 IMPLANT
BLADE SURG 10 STRL SS (BLADE) ×6 IMPLANT
BLADE SURG 15 STRL LF DISP TIS (BLADE) ×2 IMPLANT
BLADE SURG 15 STRL SS (BLADE) ×3
BNDG CMPR MED 10X6 ELC LF (GAUZE/BANDAGES/DRESSINGS) ×2
BNDG ELASTIC 6X10 VLCR STRL LF (GAUZE/BANDAGES/DRESSINGS) ×3 IMPLANT
CANISTER SUCT 1200ML W/VALVE (MISCELLANEOUS) ×3 IMPLANT
CHLORAPREP W/TINT 26 (MISCELLANEOUS) ×6 IMPLANT
CLIP APPLIE 9.375 MED OPEN (MISCELLANEOUS) ×2 IMPLANT
CLIP TI WIDE RED SMALL 6 (CLIP) IMPLANT
COVER BACK TABLE 60X90IN (DRAPES) ×3 IMPLANT
COVER MAYO STAND STRL (DRAPES) ×3 IMPLANT
COVER PROBE W GEL 5X96 (DRAPES) ×4 IMPLANT
DERMABOND ADVANCED (GAUZE/BANDAGES/DRESSINGS)
DERMABOND ADVANCED .7 DNX12 (GAUZE/BANDAGES/DRESSINGS) ×2 IMPLANT
DRAIN PENROSE .5X12 LATEX STL (DRAIN) ×1 IMPLANT
DRAPE LAPAROSCOPIC ABDOMINAL (DRAPES) ×3 IMPLANT
DRAPE UTILITY XL STRL (DRAPES) ×3 IMPLANT
DRSG PAD ABDOMINAL 8X10 ST (GAUZE/BANDAGES/DRESSINGS) ×10 IMPLANT
ELECT REM PT RETURN 9FT ADLT (ELECTROSURGICAL) ×3
ELECTRODE REM PT RTRN 9FT ADLT (ELECTROSURGICAL) ×2 IMPLANT
GAUZE SPONGE 4X4 12PLY STRL (GAUZE/BANDAGES/DRESSINGS) ×6 IMPLANT
GAUZE SPONGE 4X4 12PLY STRL LF (GAUZE/BANDAGES/DRESSINGS) IMPLANT
GLOVE BIO SURGEON STRL SZ7.5 (GLOVE) ×3 IMPLANT
GLOVE BIOGEL M STRL SZ7.5 (GLOVE) ×3 IMPLANT
GLOVE BIOGEL PI IND STRL 7.0 (GLOVE) IMPLANT
GLOVE BIOGEL PI IND STRL 8 (GLOVE) ×2 IMPLANT
GLOVE BIOGEL PI INDICATOR 7.0 (GLOVE) ×2
GLOVE BIOGEL PI INDICATOR 8 (GLOVE) ×1
GLOVE SURG SIGNA 7.5 PF LTX (GLOVE) ×3 IMPLANT
GLOVE SURG SS PI 7.0 STRL IVOR (GLOVE) ×1 IMPLANT
GOWN STRL REUS W/ TWL LRG LVL3 (GOWN DISPOSABLE) ×2 IMPLANT
GOWN STRL REUS W/ TWL XL LVL3 (GOWN DISPOSABLE) ×4 IMPLANT
GOWN STRL REUS W/TWL LRG LVL3 (GOWN DISPOSABLE) ×9
GOWN STRL REUS W/TWL XL LVL3 (GOWN DISPOSABLE) ×6
KIT MARKER MARGIN INK (KITS) ×3 IMPLANT
MARKER SKIN DUAL TIP RULER LAB (MISCELLANEOUS) ×2 IMPLANT
NDL FILTER BLUNT 18X1 1/2 (NEEDLE) ×2 IMPLANT
NDL HYPO 25X1 1.5 SAFETY (NEEDLE) ×2 IMPLANT
NDL SAFETY ECLIPSE 18X1.5 (NEEDLE) ×2 IMPLANT
NDL SPNL 18GX3.5 QUINCKE PK (NEEDLE) ×2 IMPLANT
NEEDLE FILTER BLUNT 18X 1/2SAF (NEEDLE) ×1
NEEDLE FILTER BLUNT 18X1 1/2 (NEEDLE) ×2 IMPLANT
NEEDLE HYPO 18GX1.5 SHARP (NEEDLE) ×3
NEEDLE HYPO 25X1 1.5 SAFETY (NEEDLE) ×3 IMPLANT
NEEDLE SPNL 18GX3.5 QUINCKE PK (NEEDLE) ×3 IMPLANT
NS IRRIG 1000ML POUR BTL (IV SOLUTION) ×3 IMPLANT
PACK BASIN DAY SURGERY FS (CUSTOM PROCEDURE TRAY) ×3 IMPLANT
PENCIL SMOKE EVACUATOR (MISCELLANEOUS) ×3 IMPLANT
SLEEVE SCD COMPRESS KNEE MED (STOCKING) ×3 IMPLANT
SPIKE FLUID TRANSFER (MISCELLANEOUS) IMPLANT
SPONGE T-LAP 18X18 ~~LOC~~+RFID (SPONGE) ×10 IMPLANT
SPONGE T-LAP 4X18 ~~LOC~~+RFID (SPONGE) ×2 IMPLANT
STAPLER INSORB 30 2030 C-SECTI (MISCELLANEOUS) ×3 IMPLANT
STAPLER VISISTAT 35W (STAPLE) ×3 IMPLANT
STRIP SUTURE WOUND CLOSURE 1/2 (MISCELLANEOUS) ×9 IMPLANT
SUT MNCRL AB 4-0 PS2 18 (SUTURE) ×11 IMPLANT
SUT PDS 3-0 CT2 (SUTURE) ×6
SUT PDS II 3-0 CT2 27 ABS (SUTURE) ×6 IMPLANT
SUT SILK 2 0 SH (SUTURE) IMPLANT
SUT VIC AB 3-0 PS1 18 (SUTURE)
SUT VIC AB 3-0 PS1 18XBRD (SUTURE) IMPLANT
SUT VIC AB 3-0 SH 27 (SUTURE)
SUT VIC AB 3-0 SH 27X BRD (SUTURE) ×2 IMPLANT
SUT VLOC 180 P-14 24 (SUTURE) ×6 IMPLANT
SYR 50ML LL SCALE MARK (SYRINGE) ×1 IMPLANT
SYR BULB IRRIG 60ML STRL (SYRINGE) ×3 IMPLANT
SYR CONTROL 10ML LL (SYRINGE) ×2 IMPLANT
TAPE MEASURE VINYL STERILE (MISCELLANEOUS) IMPLANT
TOWEL GREEN STERILE FF (TOWEL DISPOSABLE) ×6 IMPLANT
TRACER MAGTRACE VIAL (MISCELLANEOUS) ×1 IMPLANT
TRAY FAXITRON CT DISP (TRAY / TRAY PROCEDURE) ×3 IMPLANT
TUBE CONNECTING 20X1/4 (TUBING) ×3 IMPLANT
TUBING INFILTRATION IT-10001 (TUBING) ×3 IMPLANT
UNDERPAD 30X36 HEAVY ABSORB (UNDERPADS AND DIAPERS) ×6 IMPLANT
YANKAUER SUCT BULB TIP NO VENT (SUCTIONS) ×3 IMPLANT

## 2022-04-04 NOTE — Op Note (Signed)
Operative Note   DATE OF OPERATION: 04/04/2022  LOCATION: [facility]   SURGICAL DEPARTMENT: Plastic Surgery  PREOPERATIVE DIAGNOSES: Right breast cancer  POSTOPERATIVE DIAGNOSES:  same  PROCEDURE: 1.  Right oncoplastic breast reduction 2.  Left breast reduction for symmetry  SURGEON: Talmadge Coventry, MD  ASSISTANT: Verdie Shire, PA The advanced practice practitioner (APP) assisted throughout the case.  The APP was essential in retraction and counter traction when needed to make the case progress smoothly.  This retraction and assistance made it possible to see the tissue planes for the procedure.  The assistance was needed for hemostasis, tissue re-approximation and closure of the incision site.   ANESTHESIA: General.  COMPLICATIONS: None.   INDICATIONS FOR PROCEDURE:  The patient, Grace Harvey is a 41 y.o. female born on 1981/08/01, is here for treatment of right breast cancer  MRN: [id number]  CONSENT:  Informed consent was obtained directly from the patient. Risks, benefits and alternatives were fully discussed. Specific risks including but not limited to bleeding, infection, hematoma, seroma, scarring, pain, infection, contracture, asymmetry, wound healing problems, and need for further surgery were all discussed. The patient did have an ample opportunity to have questions answered to satisfaction.   DESCRIPTION OF PROCEDURE:  The patient was marked preoperatively for a Wise pattern skin excision.  The patient was taken to the operating room. SCDs were placed and antibiotics were given. General anesthesia was administered.The patient's operative site was prepped and draped in a sterile fashion. A time out was performed and all information was confirmed to be correct.  Dr. Ninfa Linden performed a right sided lumpectomy along with right-sided sentinel lymph node biopsy.  Patient was then turned over to me.  Right Breast: The breast was infiltrated with tumescent  solution to help with hemostasis.  The nipple was marked with a cookie cutter.  A superomedial pedicle was drawn out with the base of at least 8 cm in size.  A breast tourniquet was then applied and the pedicle was de-epithelialized.  Breast tourniquet was then let down and all incisions were made with a 10 blade.  The pedicle was then isolated down to the chest wall with cautery and the excision was performed removing tissue primarily inferiorly and laterally.  I did leave a bit more tissue laterally than usual to accommodate for the superolateral lumpectomy defect.  A Penrose drain was left in the right axilla exiting through the lateral breast reduction incision and secured with Monocryl suture.  Hemostasis was obtained and the wound was stapled closed.  Left breast:  The breast was infiltrated with tumescent solution to help with hemostasis.  The nipple was marked with a cookie cutter.  A superomedial pedicle was drawn out with the base of at least 8 cm in size.  A breast tourniquet was then applied and the pedicle was de-epithelialized.  Breast tourniquet was then let down and all incisions were made with a 10 blade.  The pedicle was then isolated down to the chest wall with cautery and the excision was performed removing tissue primarily inferiorly and laterally.  Hemostasis was obtained and the wound was stapled closed.  Patient was then set up to check for size and symmetry.  Minor modifications were made.  This resulted in a total of 538g removed from the right side and 622g removed from the left side.  The inframammary incision was closed with a combination of buried in-sorb staples and a running 3-0 Quill suture.  The vertical and periareolar  limbs were closed with interrupted buried 4-0 Monocryl and a running 4-0 Quill suture.  Steri-Strips were then applied along with a soft dressing and Ace wrap.  The patient tolerated the procedure well.  There were no complications. The patient was allowed to  wake from anesthesia, extubated and taken to the recovery room in satisfactory condition.  I was present for the entire procedure.

## 2022-04-07 ENCOUNTER — Encounter (HOSPITAL_BASED_OUTPATIENT_CLINIC_OR_DEPARTMENT_OTHER): Payer: Self-pay | Admitting: Surgery

## 2022-04-07 LAB — SURGICAL PATHOLOGY

## 2022-04-08 ENCOUNTER — Telehealth: Payer: Self-pay | Admitting: *Deleted

## 2022-04-08 ENCOUNTER — Encounter: Payer: Self-pay | Admitting: *Deleted

## 2022-04-10 ENCOUNTER — Ambulatory Visit (INDEPENDENT_AMBULATORY_CARE_PROVIDER_SITE_OTHER): Payer: No Typology Code available for payment source | Admitting: Plastic Surgery

## 2022-04-10 DIAGNOSIS — Z170 Estrogen receptor positive status [ER+]: Secondary | ICD-10-CM

## 2022-04-10 DIAGNOSIS — C50411 Malignant neoplasm of upper-outer quadrant of right female breast: Secondary | ICD-10-CM

## 2022-04-10 NOTE — Progress Notes (Signed)
Patient presents postop from a right oncoplastic reduction and a left breast reduction for symmetry.  She feels like things are going well.  Minimal output from the Penrose drain on the right side so it was removed.  Her incisions are healing nicely and bilateral areolas are viable.  She has a very nice shape and symmetry result thus far.  Her pathology was reviewed.  Although there was a positive margin on the cancer specimen I did remove additional tissue inferior to that as part of the reduction and no cancer was found in that so I believe she is considered to be clear on margins.  We will plan to see her again in a few weeks.  After that she will be getting radiation to the right side.  All of her questions were answered.

## 2022-04-11 ENCOUNTER — Encounter (HOSPITAL_COMMUNITY): Payer: Self-pay

## 2022-04-17 ENCOUNTER — Other Ambulatory Visit: Payer: Self-pay

## 2022-04-17 ENCOUNTER — Encounter: Payer: Self-pay | Admitting: *Deleted

## 2022-04-17 ENCOUNTER — Inpatient Hospital Stay
Payer: No Typology Code available for payment source | Attending: Hematology and Oncology | Admitting: Hematology and Oncology

## 2022-04-17 ENCOUNTER — Telehealth: Payer: Self-pay | Admitting: *Deleted

## 2022-04-17 DIAGNOSIS — C50411 Malignant neoplasm of upper-outer quadrant of right female breast: Secondary | ICD-10-CM | POA: Diagnosis present

## 2022-04-17 DIAGNOSIS — Z170 Estrogen receptor positive status [ER+]: Secondary | ICD-10-CM | POA: Diagnosis not present

## 2022-04-17 NOTE — Progress Notes (Signed)
Patient Care Team: Mayra Neer, MD as PCP - General (Family Medicine) Coralie Keens, MD as Consulting Physician (General Surgery) Nicholas Lose, MD as Consulting Physician (Hematology and Oncology) Kyung Rudd, MD as Consulting Physician (Radiation Oncology) Mauro Kaufmann, RN as Oncology Nurse Navigator Rockwell Germany, RN as Oncology Nurse Navigator  DIAGNOSIS:  Encounter Diagnosis  Name Primary?   Malignant neoplasm of upper-outer quadrant of right breast in female, estrogen receptor positive ([place])     SUMMARY OF ONCOLOGIC HISTORY: Oncology History  Malignant neoplasm of upper-outer quadrant of right breast in female, estrogen receptor positive ([place])  02/10/2022 Initial Diagnosis   Palpable right breast mass UOQ spiculated mass at 10 o'clock position 1.8 cm, axilla negative, ultrasound-guided biopsy: Grade 2 IDC, ER 100%, PR 100%, HER2 equivocal by IHC, FISH negative, Ki-67 30%   02/19/2022 Cancer Staging   Staging form: Breast, AJCC 8th Edition - Clinical: Stage IA (cT1c, cN0, cM0, G2, ER+, PR+, HER2-) - Signed by Nicholas Lose, MD on 02/19/2022 Stage prefix: Initial diagnosis Histologic grading system: 3 grade system   02/28/2022 Genetic Testing   Negative hereditary cancer genetic testing: no pathogenic variants detected in [facility] [employer].  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  Report dates are 02/28/2022 and Mar 04, 2022.   The CustomNext-Cancer+RNAinsight panel offered by [facility] includes sequencing and rearrangement analysis for the following 47 genes:  APC, ATM, AXIN2, BARD1, BMPR1A, BRCA1, BRCA2, BRIP1, CDH1, CDK4, CDKN2A, CHEK2, DICER1, EPCAM, GREM1, HOXB13, MEN1, MLH1, MSH2, MSH3, MSH6, MUTYH, NBN, NF1, NF2, NTHL1, PALB2, PMS2, POLD1, POLE, PTEN, RAD51C, RAD51D, RECQL, RET, SDHA, SDHAF2, SDHB, SDHC, SDHD, SMAD4, SMARCA4, STK11, TP53, TSC1, TSC2, and VHL.  RNA data is routinely analyzed for use in variant interpretation  for all genes.   04/04/2022 Surgery   Right lumpectomy: Grade 2 IDC 3.7 cm, with DCIS intermediate grade, 0/4 lymph nodes negative, inferior margin was removed through mammoplasty and therefore all margins are clear.  ER 100%, PR 100%, Ki-67 equivocal by IHC negative by FISH, Ki-67 30%     CHIEF COMPLIANT: Right breast cancer status postlumpectomy  INTERVAL HISTORY: Grace Harvey is a  41 y.o. female is here because of recent diagnosis of right breast cancer. She presents to the clinic today for a follow-up after recent right lumpectomy and bilateral breast reductions.  She has an area on the right breast which appears to be firmer and there appears to be some swelling as well.  Denies any fevers or chills.  Has mild redness to it.  She is otherwise healing and recovering very well from the prior surgery.   ALLERGIES:  has No Known Allergies.  MEDICATIONS:  Current Outpatient Medications  Medication Sig Dispense Refill   sertraline (ZOLOFT) 50 MG tablet Take 1 & 1/2 tablet by mouth once a day 135 tablet 0   No current facility-administered medications for this visit.    PHYSICAL EXAMINATION: ECOG PERFORMANCE STATUS: 1 - Symptomatic but completely ambulatory  Vitals:   04/17/22 1140  BP: (!) 142/84  Pulse: 86  Resp: 18  Temp: (!) 97.3 F (36.3 C)  SpO2: 99%   Filed Weights   04/17/22 1140  Weight: 186 lb 12.8 oz (84.7 kg)    BREAST: Right breast swelling and mild redness but it appears to be what to be expected in the postoperative..  I did not think there was any infection.  She has an appointment to see Dr. Ninfa Linden tomorrow.  (exam performed in  the presence of a chaperone)  LABORATORY DATA:  I have reviewed the data as listed    Latest Ref Rng & Units 02/19/2022   12:12 PM 06/11/2015   11:17 AM  CMP  Glucose 70 - 99 mg/dL 98  85   BUN 6 - 20 mg/dL 19  11   Creatinine 0.44 - 1.00 mg/dL 0.74  0.73   Sodium 135 - 145 mmol/L 137  141   Potassium 3.5 - 5.1 mmol/L 3.8   4.3   Chloride 98 - 111 mmol/L 106  105   CO2 22 - 32 mmol/L 24  26   Calcium 8.9 - 10.3 mg/dL 9.3  9.2   Total Protein 6.5 - 8.1 g/dL 7.6  7.0   Total Bilirubin 0.3 - 1.2 mg/dL 0.5  0.5   Alkaline Phos 38 - 126 U/L 45  46   AST 15 - 41 U/L 12  12   ALT 0 - 44 U/L 14  9     Lab Results  Component Value Date   WBC 6.9 02/19/2022   HGB 14.5 02/19/2022   HCT 42.6 02/19/2022   MCV 88.2 02/19/2022   PLT 306 02/19/2022   NEUTROABS 4.0 02/19/2022    ASSESSMENT & PLAN:  Malignant neoplasm of upper-outer quadrant of right breast in female, estrogen receptor positive ([place]) 02/10/2022:Palpable right breast mass UOQ spiculated mass at 10 o'clock position 1.8 cm, axilla negative, ultrasound-guided biopsy: Grade 2 IDC, ER 100%, PR 100%, HER2 equivocal by IHC, FISH negative, Ki-67 30%  04/04/2022:Right lumpectomy: Grade 2 IDC 3.7 cm, with DCIS intermediate grade, 0/4 lymph nodes negative, inferior margin was removed through mammoplasty and therefore all margins are clear.  ER 100%, PR 100%, Ki-67 equivocal by IHC negative by FISH, Ki-67 30%  Pathology counseling: I discussed the final pathology report of the patient provided  a copy of this report. I discussed the margins as well as lymph node surgeries. We also discussed the final staging along with previously performed ER/PR and HER-2/neu testing.  Treatment plan: 1. Oncotype DX testing to determine if chemotherapy would be of any benefit followed by 2. Adjuvant radiation therapy followed by 3. Adjuvant antiestrogen therapy  Return to clinic based upon Oncotype DX test result    No orders of the defined types were placed in this encounter.  The patient has a good understanding of the overall plan. she agrees with it. she will call with any problems that may develop before the next visit here. Total time spent: 30 mins including face to face time and time spent for planning, charting and co-ordination of care   Harriette Ohara,  MD 04/17/22    I Gardiner Coins am scribing for Dr. Lindi Adie  I have reviewed the above documentation for accuracy and completeness, and I agree with the above.

## 2022-04-17 NOTE — Assessment & Plan Note (Signed)
02/10/2022:Palpable right breast mass UOQ spiculated mass at 10 o'clock position 1.8 cm, axilla negative, ultrasound-guided biopsy: Grade 2 IDC, ER 100%, PR 100%, HER2 equivocal by IHC, FISH negative, Ki-67 30%  04/04/2022:Right lumpectomy: Grade 2 IDC 3.7 cm, with DCIS intermediate grade, 0/4 lymph nodes negative, inferior margin was removed through mammoplasty and therefore all margins are clear.  ER 100%, PR 100%, Ki-67 equivocal by IHC negative by FISH, Ki-67 30%  Pathology counseling: I discussed the final pathology report of the patient provided  a copy of this report. I discussed the margins as well as lymph node surgeries. We also discussed the final staging along with previously performed ER/PR and HER-2/neu testing.  Treatment plan: 1. Oncotype DX testing to determine if chemotherapy would be of any benefit followed by 2. Adjuvant radiation therapy followed by 3. Adjuvant antiestrogen therapy  Return to clinic based upon Oncotype DX test result 

## 2022-04-17 NOTE — Telephone Encounter (Signed)
Received oncotype score of 10. Physician team notified. Called pt with results, discussed chemo not recommended and next step is xrt. Received verbal understanding. Informed pt will receive call from Dr. Ida Rogue office with an appt. No further needs voiced.

## 2022-04-18 ENCOUNTER — Other Ambulatory Visit (HOSPITAL_COMMUNITY): Payer: Self-pay

## 2022-04-18 ENCOUNTER — Encounter (HOSPITAL_COMMUNITY): Payer: Self-pay

## 2022-04-18 MED ORDER — CEPHALEXIN 500 MG PO CAPS
ORAL_CAPSULE | ORAL | 0 refills | Status: DC
  Filled 2022-04-18: qty 15, 5d supply, fill #0

## 2022-04-18 MED ORDER — VENLAFAXINE HCL ER 37.5 MG PO CP24
ORAL_CAPSULE | ORAL | 3 refills | Status: DC
  Filled 2022-04-18: qty 30, 30d supply, fill #0

## 2022-04-20 ENCOUNTER — Encounter: Payer: Self-pay | Admitting: Plastic Surgery

## 2022-04-20 NOTE — Telephone Encounter (Signed)
Left message for patient to call office.  

## 2022-04-24 ENCOUNTER — Encounter: Payer: Self-pay | Admitting: *Deleted

## 2022-04-25 ENCOUNTER — Other Ambulatory Visit (HOSPITAL_COMMUNITY): Payer: Self-pay

## 2022-04-25 MED ORDER — DOXYCYCLINE MONOHYDRATE 100 MG PO CAPS
ORAL_CAPSULE | ORAL | 0 refills | Status: DC
  Filled 2022-04-25: qty 20, 10d supply, fill #0

## 2022-04-28 ENCOUNTER — Encounter: Payer: Self-pay | Admitting: Physical Therapy

## 2022-04-28 ENCOUNTER — Ambulatory Visit: Payer: No Typology Code available for payment source | Admitting: Physician Assistant

## 2022-04-28 ENCOUNTER — Ambulatory Visit: Payer: No Typology Code available for payment source | Attending: Surgery | Admitting: Physical Therapy

## 2022-04-28 DIAGNOSIS — Z170 Estrogen receptor positive status [ER+]: Secondary | ICD-10-CM | POA: Diagnosis present

## 2022-04-28 DIAGNOSIS — C50411 Malignant neoplasm of upper-outer quadrant of right female breast: Secondary | ICD-10-CM | POA: Insufficient documentation

## 2022-04-28 DIAGNOSIS — Z483 Aftercare following surgery for neoplasm: Secondary | ICD-10-CM | POA: Diagnosis present

## 2022-04-28 DIAGNOSIS — R293 Abnormal posture: Secondary | ICD-10-CM | POA: Diagnosis present

## 2022-04-28 NOTE — Progress Notes (Signed)
Radiation Oncology         [phone] ________________________________   Outpatient Follow Up - Conducted via telephone at patient request.  I spoke with the patient to conduct this consult visit via telephone. The patient was notified in advance and was offered an in person or telemedicine meeting to allow for face to face communication but instead preferred to proceed with a telephone visit.      Name: Grace Harvey        MRN: [id number]  Date of Service: 05/01/2022 DOB: 1981/08/25  IR:SWNI, Nathen May, MD  Nicholas Lose, MD     REFERRING PHYSICIAN: Nicholas Lose, MD   DIAGNOSIS: The encounter diagnosis was Malignant neoplasm of upper-outer quadrant of right breast in female, estrogen receptor positive ([place]).   HISTORY OF PRESENT ILLNESS: Grace Harvey is a 41 y.o. female seen in the multidisciplinary breast clinic for a new diagnosis of right breast cancer. The patient was noted to have a palpable mass in the right breast.  By diagnostic imaging a mass in the 10 o'clock position in the right breast was seen measuring 1.8 cm by ultrasound.  Her axilla was negative for adenopathy.  A biopsy showed grade 2 invasive ductal carcinoma that was ER/PR positive, HER2 negative with a Ki-67 of 30%.  Since her last visit, the patient has undergone a right lumpectomy with sentinel lymph node biopsy on 04/04/2022.  Final pathology showed a 3.7 cm area of invasive ductal carcinoma that was grade 2.  Associated intermediate grade DCIS was present.  Invasive carcinoma involves the inferior margin and 4 sentinel lymph nodes were sampled all negative for disease.  She did undergo bilateral mammoplasty and benign tissue was submitted within the right breast specimen and also benign tissue on the left.  Oncotype DX score resulted with a score of 10 so no systemic chemotherapy is recommended.  Postoperatively she developed a right breast seroma requiring aspiration, she was also treated for possible  cellulitis with antibiotics.  She is contacted by phone today to discuss adjuvant radiotherapy.   PREVIOUS RADIATION THERAPY: No   PAST MEDICAL HISTORY:  Past Medical History:  Diagnosis Date   Cervical intraepithelial neoplasm HIGH GRADE   Family history of breast cancer 02/19/2022   Family history of prostate cancer 02/19/2022       PAST SURGICAL HISTORY: Past Surgical History:  Procedure Laterality Date   BREAST LUMPECTOMY WITH RADIOACTIVE SEED AND SENTINEL LYMPH NODE BIOPSY Right 04/04/2022   Procedure: RIGHT BREAST LUMPECTOMY WITH RADIOACTIVE SEED AND SENTINEL LYMPH NODE BIOPSY;  Surgeon: Coralie Keens, MD;  Location: [facility];  Service: General;  Laterality: Right;   BREAST REDUCTION SURGERY Bilateral 04/04/2022   Procedure: RIGHT SIDE ONCOPLASTIC BREAST REDUCTION, LEFT SIDE BREAST REDUCTION FOR SYMMETRY;  Surgeon: Cindra Presume, MD;  Location: [facility];  Service: Plastics;  Laterality: Bilateral;   INTRAUTERINE DEVICE (IUD) INSERTION     mirena iud insertion 12-23-19   IUD REMOVAL  03/02/2012   MIRENA   laser vaporization of cervix  10/14/2011   RESET FX RIGHT ARM  AGE 58   WISDOM TOOTH EXTRACTION  03/04/2012   ORAL SURGEON OFFICE     FAMILY HISTORY:  Family History  Problem Relation Age of Onset   Hypertension Father    Prostate cancer Father 58   Breast cancer Maternal Aunt 41   Breast cancer Other        MGM's sister; dx 16s     SOCIAL HISTORY:  reports that she has never smoked. She has never used smokeless tobacco. She reports that she does not drink alcohol and does not use drugs. The patient is married and works at [employer].   ALLERGIES: Patient has no known allergies.   MEDICATIONS:  Current Outpatient Medications  Medication Sig Dispense Refill   cephALEXin (KEFLEX) 500 MG capsule Take 1 capsule (500 mg total) by mouth 3 (three) times daily for 5 days 15 capsule 0   doxycycline (MONODOX) 100 MG capsule  Take 1 capsule (100 mg total) by mouth 2 (two) times daily for 10 days 20 capsule 0   sertraline (ZOLOFT) 50 MG tablet Take 1 & 1/2 tablet by mouth once a day 135 tablet 0   venlafaxine XR (EFFEXOR-XR) 37.5 MG 24 hr capsule Take 1 capsule by mouth with food once a day 30 capsule 3   No current facility-administered medications for this visit.     REVIEW OF SYSTEMS: On review of systems, the patient reports that she is doing well. She reports her breast fullness has improved since taking antibiotics. She reports redness has improved as well that had been seen. No other complaints are verbalized.      PHYSICAL EXAM:  Unable to assess due to encounter.    ECOG = 0  0 - Asymptomatic (Fully active, able to carry on all predisease activities without restriction)  1 - Symptomatic but completely ambulatory (Restricted in physically strenuous activity but ambulatory and able to carry out work of a light or sedentary nature. For example, light housework, office work)  2 - Symptomatic, <50% in bed during the day (Ambulatory and capable of all self care but unable to carry out any work activities. Up and about more than 50% of waking hours)  3 - Symptomatic, >50% in bed, but not bedbound (Capable of only limited self-care, confined to bed or chair 50% or more of waking hours)  4 - Bedbound (Completely disabled. Cannot carry on any self-care. Totally confined to bed or chair)  5 - Death   Eustace Pen MM, Creech RH, Tormey DC, et al. [phone]). "Toxicity and response criteria of the [facility]". [place] Oncol. 5 (6): 649-55    LABORATORY DATA:  Lab Results  Component Value Date   WBC 6.9 02/19/2022   HGB 14.5 02/19/2022   HCT 42.6 02/19/2022   MCV 88.2 02/19/2022   PLT 306 02/19/2022   Lab Results  Component Value Date   NA 137 02/19/2022   K 3.8 02/19/2022   CL 106 02/19/2022   CO2 24 02/19/2022   Lab Results  Component Value Date   ALT 14 02/19/2022   AST  12 (L) 02/19/2022   ALKPHOS 45 02/19/2022   BILITOT 0.5 02/19/2022      RADIOGRAPHY: MM Breast Surgical Specimen  Result Date: 04/04/2022 CLINICAL DATA:  Status post seed localized lumpectomy of the RIGHT breast. EXAM: SPECIMEN RADIOGRAPH OF THE RIGHT BREAST COMPARISON:  Prior studies. FINDINGS: Status post excision of the right ribbon shaped breast. The radioactive seed and biopsy marker clip are present and completely intact. Findings are discussed with the operating room nurse at the time of interpretation. IMPRESSION: Specimen radiograph of the right breast. Electronically Signed   By: Nolon Nations M.D.   On: 04/04/2022 13:32   MM CLIP PLACEMENT RIGHT  Result Date: 04/03/2022 CLINICAL DATA:  Status post seed placement for right breast mass. EXAM: DIAGNOSTIC RIGHT MAMMOGRAM POST ULTRASOUND-GUIDED RADIOACTIVE SEED PLACEMENT COMPARISON:  Previous exam(s).  FINDINGS: Mammographic images were obtained following ultrasound-guided radioactive seed placement. These demonstrate radioactive seed located within the right breast mass. IMPRESSION: Appropriate location of the radioactive seed. Final Assessment: Post Procedure Mammograms for Seed Placement Electronically Signed   By: Lovey Newcomer M.D.   On: 04/03/2022 13:50   [place] RT RADIOACTIVE SEED LOC  Result Date: 04/03/2022 CLINICAL DATA:  Patient for preoperative localization prior to right breast lumpectomy. EXAM: ULTRASOUND GUIDED RADIOACTIVE SEED LOCALIZATION OF THE RIGHT BREAST COMPARISON:  [facility] FINDINGS: Patient presents for radioactive seed localization prior to right lumpectomy. I met with the patient and we discussed the procedure of seed localization including benefits and alternatives. We discussed the high likelihood of a successful procedure. We discussed the risks of the procedure including infection, bleeding, tissue injury and further surgery. We discussed the low dose of radioactivity involved in the procedure. Informed, written consent  was given. The usual time-out protocol was performed immediately prior to the procedure. Using ultrasound guidance, sterile technique, 1% lidocaine and an I-125 radioactive seed, right breast mass 10 o'clock position was localized using a lateral approach. The follow-up mammogram images confirm the seed in the expected location and were marked for Dr. Ninfa Linden. Follow-up survey of the patient confirms presence of the radioactive seed. Order number of I-125 seed:  [id number]. Total activity:  3.532 millicuries reference Date: Mar 11, 2022 The patient tolerated the procedure well and was released from the [facility]. She was given instructions regarding seed removal. IMPRESSION: Radioactive seed localization right breast. No apparent complications. Electronically Signed   By: Lovey Newcomer M.D.   On: 04/03/2022 13:49      IMPRESSION/PLAN: 1. Stage IA, pT2N0M0, grade 2, ER/PR positive invasive ductal carcinoma of the right breast. Dr. Lisbeth Renshaw discusses the pathology findings and reviews the nature of early stage right breast disease.  She has done well since surgery, and does not need systemic chemotherapy based on her Oncotype DX score.  Dr. Lisbeth Renshaw has recommended a course of external radiotherapy to the breast  to reduce risks of local recurrence followed by antiestrogen therapy. We discussed the risks, benefits, short, and long term effects of radiotherapy, as well as the curative intent, and the patient is interested in proceeding.  I reviewed the delivery and logistics of radiotherapy and Dr. Lisbeth Renshaw would favor a 6 1/2 week course to the right breast however we also discussed a hypofractionated course.  We will work with her insurance to determine coverage and the patient is aware of this.  Written consent is obtained and placed in the chart, a copy was provided to the patient. The patient will return on Tuesday next week to simulate.  2. Contraceptive Counseling. The patient uses Mirena IUD and is aware of the  need to avoid pregnancy.    This encounter was conducted via telephone.  The patient has provided two factor identification and has given verbal consent for this type of encounter and has been advised to only accept a meeting of this type in a secure network environment. The time spent during this encounter was 35 minutes including preparation, discussion, and coordination of the patient's care. The attendants for this meeting include   Hayden Pedro  and [place] [employer].  During the encounter, Hayden Pedro were located at [facility] Radiation Oncology Department.  Grace Harvey was located at home.        Carola Rhine, [facility]    **Disclaimer: This note was dictated with voice recognition  software. Similar sounding words can inadvertently be transcribed and this note may contain transcription errors which may not have been corrected upon publication of note.**

## 2022-04-28 NOTE — Patient Instructions (Signed)
[facility]  [address]  [place] [address]  [phone]  After Breast Cancer Class It is recommended you attend the ABC class to be educated on lymphedema risk reduction. This class is free of charge and lasts for 1 hour. It is a 1-time class. You will need to download the Webex app either on your phone or computer. We will send you a link the night before or the morning of the class. You should be able to click on that link to join the class. This is not a confidential class. You don't have to turn your camera on, but other participants may be able to see your email address. You are scheduled for August 21st at 11:00.  Scar massage You can begin gentle scar massage to you incision sites. Gently place one hand on the incision and move the skin (without sliding on the skin) in various directions. Do this for a few minutes and then you can gently massage either coconut oil or vitamin E cream into the scars. YOU AREN'T NOT READY QUITE YET. GLUE NEEDS TO BE GONE AND INCISIONS COMPLETELY HEALED.  Compression garment You should continue wearing your compression bra until you feel like you no longer have swelling.  Home exercise Program Continue doing the exercises you were given until you feel like you can do them without feeling any tightness at the end.   Walking Program Studies show that 30 minutes of walking per day (fast enough to elevate your heart rate) can significantly reduce the risk of a cancer recurrence. If you can't walk due to other medical reasons, we encourage you to find another activity you could do (like a stationary bike or water exercise).  Posture After breast cancer surgery, people frequently sit with rounded shoulders posture because it puts their incisions on slack and feels better. If you sit like this and scar tissue forms in that position, you can become very tight and have pain sitting or standing with good posture. Try to be aware  of your posture and sit and stand up tall to heal properly.  Follow up PT: It is recommended you return every 3 months for the first 3 years following surgery to be assessed on the SOZO machine for an L-Dex score. This helps prevent clinically significant lymphedema in 95% of patients. These follow up screens are 10 minute appointments that you are not billed for. You're scheduled for September 25th at 10:20.  Axillary web syndrome (also called cording) can happen after having breast cancer surgery when lymph nodes in the armpit are removed. It presents as if you have a thin cord in your arm and can run from the armpit all the way down into the forearm. If you've had a sentinel node biopsy, the risk is 1-20% and if you've had an axillary lymph node dissection (more than 7 nodes removed), the risk is 36-72%. The ranges vary depending on the research study.  It most often happens 3-4 weeks post-op but can happen sooner or later. There are several possibilities for what cording actually is. Although no one knows for sure as of yet, it may be related to lymphatics, veins, or other tissue. Sometimes cording resolves on its own but other times it requires physical therapy with a therapist who specializes in lymphedema and/or cancer rehab. Treatment typically involves stretching, manual techniques, and exercise. Sometimes cords get "released" while stretching or during manual treatment and the patient may experience the sensation of a "pop."  This may feel strange but it is not dangerous and is a sign that the cord has released; range of motion may be improved in the process.  Closed Chain: Shoulder Flexion / Extension - on Wall    Hands on wall, step backward. Return. Stepping causes shoulder flexion and extension Do _5__ times, holding 5 seconds, _2_ times per day.  http://ss.exer.us/265   Copyright  VHI. All rights reserved.

## 2022-04-28 NOTE — Therapy (Signed)
OUTPATIENT PHYSICAL THERAPY BREAST CANCER POST OP FOLLOW UP   Patient Name: Grace Harvey MRN: [id number] DOB:1981-04-30, 41 y.o., female Today's Date: 04/28/2022   PT End of Session - 04/28/22 1104     Visit Number 2    Number of Visits 10    Date for PT Re-Evaluation 05/26/22    PT Start Time 1100    PT Stop Time 1200    PT Time Calculation (min) 60 min    Activity Tolerance Patient tolerated treatment well    Behavior During Therapy [facility] for tasks assessed/performed             Past Medical History:  Diagnosis Date   Cervical intraepithelial neoplasm HIGH GRADE   Family history of breast cancer 02/19/2022   Family history of prostate cancer 02/19/2022   Past Surgical History:  Procedure Laterality Date   BREAST LUMPECTOMY WITH RADIOACTIVE SEED AND SENTINEL LYMPH NODE BIOPSY Right 04/04/2022   Procedure: RIGHT BREAST LUMPECTOMY WITH RADIOACTIVE SEED AND SENTINEL LYMPH NODE BIOPSY;  Surgeon: Coralie Keens, MD;  Location: [place];  Service: General;  Laterality: Right;   BREAST REDUCTION SURGERY Bilateral 04/04/2022   Procedure: RIGHT SIDE ONCOPLASTIC BREAST REDUCTION, LEFT SIDE BREAST REDUCTION FOR SYMMETRY;  Surgeon: Cindra Presume, MD;  Location: [place];  Service: Plastics;  Laterality: Bilateral;   INTRAUTERINE DEVICE (IUD) INSERTION     mirena iud insertion 12-23-19   IUD REMOVAL  03/02/2012   MIRENA   laser vaporization of cervix  10/14/2011   RESET FX RIGHT ARM  AGE 47   WISDOM TOOTH EXTRACTION  03/04/2012   ORAL SURGEON OFFICE   Patient Active Problem List   Diagnosis Date Noted   Genetic testing 03/03/2022   Family history of breast cancer 02/19/2022   Family history of prostate cancer 02/19/2022   Malignant neoplasm of upper-outer quadrant of right breast in female, estrogen receptor positive ([place]) 02/17/2022   SVD 6/15 03/27/2018   Postpartum care following vaginal delivery 03/27/2018   Second-degree perineal  laceration, with delivery 03/27/2018    REFERRING PROVIDER: Dr. Coralie Keens  REFERRING DIAG: Right breast cancer  THERAPY DIAG:  Malignant neoplasm of upper-outer quadrant of right breast in female, estrogen receptor positive ([place])  Abnormal posture  Aftercare following surgery for neoplasm  Rationale for Evaluation and Treatment Rehabilitation  ONSET DATE: 04/04/2022  SUBJECTIVE:                                                                                                                                                                                           SUBJECTIVE STATEMENT: Patient reports she  underwent a right lumpectomy and sentinel node biopsy (4 negative nodes) with a bilateral breast reduction on 04/04/2022. Oncotype score was 10 so no chemo is needed. She has a radiation simulation 05/06/2022 and anti-estrogen therapy after radiation. 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  PERTINENT HISTORY:  Patient was diagnosed on 02/10/2022 with right grade 2 invasive ductal carcinoma breast cancer. It measures 1.8 cm and is located in the upper outer quadrant. It is ER/PR positive and HER2 negative with a Ki67 of 30%.   PATIENT GOALS:  Reassess how my recovery is going related to arm function, pain, and swelling.  PAIN:  Are you having pain? No  PRECAUTIONS: Recent Surgery, right UE Lymphedema risk  ACTIVITY LEVEL / LEISURE: She is walking regularly about 20 minutes daily.   OBJECTIVE:   PATIENT SURVEYS:  QUICK DASH:  Quick Dash - 04/28/22 0001     Open a tight or new jar Mild difficulty    Do heavy household chores (wash walls, wash floors) Mild difficulty    Carry a shopping bag or briefcase No difficulty    Wash your back No difficulty    Use a knife to cut food No difficulty    Recreational activities in which you take some force or impact through your arm, shoulder, or hand (golf, hammering,  tennis) Unable    During the past week, to what extent has your arm, shoulder or hand problem interfered with your normal social activities with family, friends, neighbors, or groups? Slightly    During the past week, to what extent has your arm, shoulder or hand problem limited your work or other regular daily activities Slightly    Arm, shoulder, or hand pain. None    Tingling (pins and needles) in your arm, shoulder, or hand Mild    Difficulty Sleeping No difficulty    DASH Score 20.45 %              OBSERVATIONS:  Bilateral breast incisions are healing well. There is edema present bilaterally. Small wound area present inferior right breast incision. Bilateral scapulae are both very tight with poor mobility, causing poor scapulohumeral rhythm. Right proximal lateral trunk cording suspected due to symptoms but not visible due to edema. Positive right UE neural tension.     POSTURE:  Forward head and rounded shoulders  LYMPHEDEMA ASSESSMENT:   UPPER EXTREMITY AROM/PROM:   A/PROM RIGHT  02/19/2022   RIGHT 04/28/2022  Shoulder extension 51 49  Shoulder flexion 152 127  Shoulder abduction 167 156  Shoulder internal rotation 78 75  Shoulder external rotation 84 86                          (Blank rows = not tested)   A/PROM LEFT  02/19/2022  Shoulder extension 48  Shoulder flexion 146  Shoulder abduction 170  Shoulder internal rotation 70  Shoulder external rotation 90                          (Blank rows = not tested)     CERVICAL AROM: All within normal limits   UPPER EXTREMITY STRENGTH: WNL     LYMPHEDEMA ASSESSMENTS:    LANDMARK RIGHT  02/19/2022 RIGHT 04/28/2022  10 cm proximal to olecranon process 29.5 29.3  Olecranon process 24.3 24.6  10 cm proximal to ulnar styloid process 22.3 21.6  Just proximal to ulnar styloid process 15.8 16.2  Across hand at thumb web space 18.7 18.5  At base of 2nd digit 6 6.2  (Blank rows = not tested)   LANDMARK LEFT  02/19/2022  LEFT 04/28/2022  10 cm proximal to olecranon process 29.5 29.9  Olecranon process 24.6 24.7  10 cm proximal to ulnar styloid process 20.8 21.2  Just proximal to ulnar styloid process 15.8 16.1  Across hand at thumb web space 17.6 17.9  At base of 2nd digit 6 6.1  (Blank rows = not tested)       Surgery type/Date: Right lumpectomy, sentinel node biopsy, bil breast reduction 04/04/2022 Number of lymph nodes removed: 4 Current/past treatment (chemo, radiation, hormone therapy): none Other symptoms:  Heaviness/tightness No Pain No Pitting edema No Infections Yes Decreased scar mobility Yes Stemmer sign No   PATIENT EDUCATION:  Education details: HEP, lymphedema risk reduction Person educated: Patient Education method: Explanation, Demonstration, and Handouts Education comprehension: verbalized understanding and returned demonstration   HOME EXERCISE PROGRAM:  Reviewed previously given post op HEP. Issued closed chain shoulder flexion exercise.   ASSESSMENT:  CLINICAL IMPRESSION: Overall, pt is doing very well s/p right lumpectomy, sentinel node biopsy, and bilateral breast reduction. She has a small area of concern in the crease under her right breast with an open area (photo taken). She has limited right shoulder flexion, poor scapular mobility, and symptoms of lateral proximal trunk cording. She will benefit from PT to address those concerns and improve shoulder ROM and scapular mobility.  Pt will benefit from skilled therapeutic intervention to improve on the following deficits: Decreased knowledge of precautions, impaired UE functional use, pain, decreased ROM, postural dysfunction.   PT treatment/interventions: ADL/Self care home management, Therapeutic exercises, Therapeutic activity, Patient/Family education, Self Care, Joint mobilization, Manual lymph drainage, scar mobilization, Manual therapy, and Re-evaluation     GOALS: Goals reviewed with patient? Yes  LONG  TERM GOALS:  (STG=LTG)  GOALS Name Target Date  Goal status  1 Pt will demonstrate she has regained full shoulder ROM and function post operatively compared to baselines.   05/26/2022 IN PROGRESS  2 Patient will report >/= 25% reduction in feeling of cording in her right lateral trunk to tolerate improved shoulder abduction and radiation positioning. 05/26/2022 INITIAL  3 Patient will increase right shoulder active flexion to >/= 150 degrees for improved overall reach. 05/26/2022 INITIAL  4 Patient will reduce her DASH score to </= 3 for improved overall UE function. 05/26/2022 INITIAL     PLAN: PT FREQUENCY/DURATION: 2x/week for 4 weeks  PLAN FOR NEXT SESSION: Right lateral trunk myofascial release   [facility]  [address], [address]  [place] [address]  [phone]  After Breast Cancer Class It is recommended you attend the ABC class to be educated on lymphedema risk reduction. This class is free of charge and lasts for 1 hour. It is a 1-time class. You will need to download the Webex app either on your phone or computer. We will send you a link the night before or the morning of the class. You should be able to click on that link to join the class. This is not a confidential class. You don't have to turn your camera on, but other participants may be able to see your email address. You are scheduled for August 21st at 11:00.  Scar massage You can begin gentle scar massage to you incision sites. Gently place one hand on the incision  and move the skin (without sliding on the skin) in various directions. Do this for a few minutes and then you can gently massage either coconut oil or vitamin E cream into the scars. YOU AREN'T NOT READY QUITE YET. GLUE NEEDS TO BE GONE AND INCISIONS COMPLETELY HEALED.  Compression garment You should continue wearing your compression bra until you feel like you no longer have swelling.  Home exercise Program Continue doing the  exercises you were given until you feel like you can do them without feeling any tightness at the end.   Walking Program Studies show that 30 minutes of walking per day (fast enough to elevate your heart rate) can significantly reduce the risk of a cancer recurrence. If you can't walk due to other medical reasons, we encourage you to find another activity you could do (like a stationary bike or water exercise).  Posture After breast cancer surgery, people frequently sit with rounded shoulders posture because it puts their incisions on slack and feels better. If you sit like this and scar tissue forms in that position, you can become very tight and have pain sitting or standing with good posture. Try to be aware of your posture and sit and stand up tall to heal properly.  Follow up PT: It is recommended you return every 3 months for the first 3 years following surgery to be assessed on the SOZO machine for an L-Dex score. This helps prevent clinically significant lymphedema in 95% of patients. These follow up screens are 10 minute appointments that you are not billed for. You're scheduled for September 25th at 10:20.  Axillary web syndrome (also called cording) can happen after having breast cancer surgery when lymph nodes in the armpit are removed. It presents as if you have a thin cord in your arm and can run from the armpit all the way down into the forearm. If you've had a sentinel node biopsy, the risk is 1-20% and if you've had an axillary lymph node dissection (more than 7 nodes removed), the risk is 36-72%. The ranges vary depending on the research study.  It most often happens 3-4 weeks post-op but can happen sooner or later. There are several possibilities for what cording actually is. Although no one knows for sure as of yet, it may be related to lymphatics, veins, or other tissue. Sometimes cording resolves on its own but other times it requires physical therapy with a therapist who  specializes in lymphedema and/or cancer rehab. Treatment typically involves stretching, manual techniques, and exercise. Sometimes cords get "released" while stretching or during manual treatment and the patient may experience the sensation of a "pop." This may feel strange but it is not dangerous and is a sign that the cord has released; range of motion may be improved in the process.  Annia Friendly, [place] 04/28/22 12:23 PM

## 2022-04-29 ENCOUNTER — Ambulatory Visit: Payer: No Typology Code available for payment source

## 2022-04-29 ENCOUNTER — Encounter: Payer: Self-pay | Admitting: Physical Therapy

## 2022-04-29 DIAGNOSIS — Z483 Aftercare following surgery for neoplasm: Secondary | ICD-10-CM

## 2022-04-29 DIAGNOSIS — Z170 Estrogen receptor positive status [ER+]: Secondary | ICD-10-CM

## 2022-04-29 DIAGNOSIS — R293 Abnormal posture: Secondary | ICD-10-CM

## 2022-04-29 DIAGNOSIS — C50411 Malignant neoplasm of upper-outer quadrant of right female breast: Secondary | ICD-10-CM | POA: Diagnosis not present

## 2022-04-29 NOTE — Therapy (Signed)
OUTPATIENT PHYSICAL THERAPY TREATMENT NOTE   Patient Name: Grace Harvey MRN: [id number] DOB:02-02-1981, 41 y.o., female Today's Date: 04/29/2022  PCP: Mayra Neer, MD REFERRING PROVIDER: Dr. Coralie Keens  END OF SESSION:   PT End of Session - 04/29/22 0909     Visit Number 3    Number of Visits 10    Date for PT Re-Evaluation 05/26/22    PT Start Time 0905    PT Stop Time 1001    PT Time Calculation (min) 56 min    Activity Tolerance Patient tolerated treatment well    Behavior During Therapy [facility] for tasks assessed/performed             Past Medical History:  Diagnosis Date   Cervical intraepithelial neoplasm HIGH GRADE   Family history of breast cancer 02/19/2022   Family history of prostate cancer 02/19/2022   Past Surgical History:  Procedure Laterality Date   BREAST LUMPECTOMY WITH RADIOACTIVE SEED AND SENTINEL LYMPH NODE BIOPSY Right 04/04/2022   Procedure: RIGHT BREAST LUMPECTOMY WITH RADIOACTIVE SEED AND SENTINEL LYMPH NODE BIOPSY;  Surgeon: Coralie Keens, MD;  Location: [place];  Service: General;  Laterality: Right;   BREAST REDUCTION SURGERY Bilateral 04/04/2022   Procedure: RIGHT SIDE ONCOPLASTIC BREAST REDUCTION, LEFT SIDE BREAST REDUCTION FOR SYMMETRY;  Surgeon: Cindra Presume, MD;  Location: [place];  Service: Plastics;  Laterality: Bilateral;   INTRAUTERINE DEVICE (IUD) INSERTION     mirena iud insertion 12-23-19   IUD REMOVAL  03/02/2012   MIRENA   laser vaporization of cervix  10/14/2011   RESET FX RIGHT ARM  AGE 53   WISDOM TOOTH EXTRACTION  03/04/2012   ORAL SURGEON OFFICE   Patient Active Problem List   Diagnosis Date Noted   Genetic testing 03/03/2022   Family history of breast cancer 02/19/2022   Family history of prostate cancer 02/19/2022   Malignant neoplasm of upper-outer quadrant of right breast in female, estrogen receptor positive ([place]) 02/17/2022   SVD 6/15 03/27/2018   Postpartum care  following vaginal delivery 03/27/2018   Second-degree perineal laceration, with delivery 03/27/2018    REFERRING DIAG: Right breast cancer  THERAPY DIAG:  Malignant neoplasm of upper-outer quadrant of right breast in female, estrogen receptor positive ([place])  Abnormal posture  Aftercare following surgery for neoplasm  Rationale for Evaluation and Treatment Rehabilitation  PERTINENT HISTORY: Patient was diagnosed on 02/10/2022 with right grade 2 invasive ductal carcinoma breast cancer. It measures 1.8 cm and is located in the upper outer quadrant. It is ER/PR positive and HER2 negative with a Ki67 of 30%.   PRECAUTIONS:  Recent Surgery, right UE Lymphedema risk  SUBJECTIVE: I am so happy to be here. Inez Catalina explained a lot of what I'm experiencing better to me yesterday and it helped validate what I've been feeling. That really helped me.   PAIN:  Are you having pain? No   OBJECTIVE: (objective measures completed at initial evaluation unless otherwise dated)   PATIENT SURVEYS:  QUICK DASH:   Quick Dash - 04/28/22 0001       Open a tight or new jar Mild difficulty     Do heavy household chores (wash walls, wash floors) Mild difficulty     Carry a shopping bag or briefcase No difficulty     Wash your back No difficulty     Use a knife to cut food No difficulty     Recreational activities in which you take some force or  impact through your arm, shoulder, or hand (golf, hammering, tennis) Unable     During the past week, to what extent has your arm, shoulder or hand problem interfered with your normal social activities with family, friends, neighbors, or groups? Slightly     During the past week, to what extent has your arm, shoulder or hand problem limited your work or other regular daily activities Slightly     Arm, shoulder, or hand pain. None     Tingling (pins and needles) in your arm, shoulder, or hand Mild     Difficulty Sleeping No difficulty     DASH Score 20.45 %                      OBSERVATIONS:            Bilateral breast incisions are healing well. There is edema present bilaterally. Small wound area present inferior right breast incision. Bilateral scapulae are both very tight with poor mobility, causing poor scapulohumeral rhythm. Right proximal lateral trunk cording suspected due to symptoms but not visible due to edema. Positive right UE neural tension.        POSTURE:  Forward head and rounded shoulders   LYMPHEDEMA ASSESSMENT:    UPPER EXTREMITY AROM/PROM:   A/PROM RIGHT  02/19/2022   RIGHT 04/28/2022  Shoulder extension 51 49  Shoulder flexion 152 127  Shoulder abduction 167 156  Shoulder internal rotation 78 75  Shoulder external rotation 84 86                          (Blank rows = not tested)   A/PROM LEFT  02/19/2022  Shoulder extension 48  Shoulder flexion 146  Shoulder abduction 170  Shoulder internal rotation 70  Shoulder external rotation 90                          (Blank rows = not tested)     CERVICAL AROM: All within normal limits   UPPER EXTREMITY STRENGTH: WNL     LYMPHEDEMA ASSESSMENTS:    LANDMARK RIGHT  02/19/2022 RIGHT 04/28/2022  10 cm proximal to olecranon process 29.5 29.3  Olecranon process 24.3 24.6  10 cm proximal to ulnar styloid process 22.3 21.6  Just proximal to ulnar styloid process 15.8 16.2  Across hand at thumb web space 18.7 18.5  At base of 2nd digit 6 6.2  (Blank rows = not tested)   LANDMARK LEFT  02/19/2022 LEFT 04/28/2022  10 cm proximal to olecranon process 29.5 29.9  Olecranon process 24.6 24.7  10 cm proximal to ulnar styloid process 20.8 21.2  Just proximal to ulnar styloid process 15.8 16.1  Across hand at thumb web space 17.6 17.9  At base of 2nd digit 6 6.1  (Blank rows = not tested)                    Surgery type/Date: Right lumpectomy, sentinel node biopsy, bil breast reduction 04/04/2022 Number of lymph nodes removed: 4 Current/past treatment (chemo, radiation,  hormone therapy): none Other symptoms:  Heaviness/tightness No Pain No Pitting edema No Infections Yes Decreased scar mobility Yes Stemmer sign No   Today's Treatment: Manual Therapy MFR: In supine to Rt lateral trunk and axilla at areas of tightness P/ROM: In Supine to Rt shoulder into flexion, abd, and D2 to pts Scap Mobs: In Lt S/L for scap mobs into protraction   and retraction, also scapular depression during P/ROM STM: In Lt S/L with cocoa butter to medial and lateral scap border where trigger points palpable and UT where palpaby tight  MLD: Towards end of session and ended session with Rt inguinal nodes, Rt axillo-inguinal anastomosis and lateral trunk over area of edema, and then Rt inguinals to complete At end of session issued nonadhesive dressing for pt to place at healing wound as this is still draining and pt was placing tissue here and this was pulling at scab     PATIENT EDUCATION:  Education details: HEP, lymphedema risk reduction Person educated: Patient Education method: Explanation, Demonstration, and Handouts Education comprehension: verbalized understanding and returned demonstration     HOME EXERCISE PROGRAM:            Reviewed previously given post op HEP. Issued closed chain shoulder flexion exercise.     ASSESSMENT:   CLINICAL IMPRESSION: Pt tolerated first session of manual therapy very well reporting feeling good stretches during and much looser in Rt upper quadrant by end of session. Issued her a piece of nonadhesive dressing to place over healing incision and per MD pt is to put Neosporin on area and see MD next week for follow up. Showed her end ROM stretches she can do at home into Rt shoulder end motions as long as she doesn't feel pull at healing incision. Pt able to verbalize good understanding.    Pt will benefit from skilled therapeutic intervention to improve on the following deficits: Decreased knowledge of precautions, impaired UE functional  use, pain, decreased ROM, postural dysfunction.    PT treatment/interventions: ADL/Self care home management, Therapeutic exercises, Therapeutic activity, Patient/Family education, Self Care, Joint mobilization, Manual lymph drainage, scar mobilization, Manual therapy, and Re-evaluation         GOALS: Goals reviewed with patient? Yes   LONG TERM GOALS:  (STG=LTG)   GOALS Name Target Date   Goal status  1 Pt will demonstrate she has regained full shoulder ROM and function post operatively compared to baselines.    05/26/2022 IN PROGRESS  2 Patient will report >/= 25% reduction in feeling of cording in her right lateral trunk to tolerate improved shoulder abduction and radiation positioning. 05/26/2022 INITIAL  3 Patient will increase right shoulder active flexion to >/= 150 degrees for improved overall reach. 05/26/2022 INITIAL  4 Patient will reduce her DASH score to </= 3 for improved overall UE function. 05/26/2022 INITIAL        PLAN: PT FREQUENCY/DURATION: 2x/week for 4 weeks   PLAN FOR NEXT SESSION: Cont right lateral trunk and axillary myofascial release; Rt shoulder end motion stretches to incr MFR stretches and MLD along Rt lateral trunk  Collie Siad, PTA 04/29/22 10:04 AM

## 2022-04-30 ENCOUNTER — Encounter: Payer: Self-pay | Admitting: Radiation Oncology

## 2022-04-30 NOTE — Progress Notes (Signed)
Telephone-Consult appointment. I verified patient's identity and began nursing interview. No issues reported at this time.  Meaningful use complete. IUD-NO chances of pregnancy.  Reminded patient of their 1:30pm-05/01/22 telephone appointment w/ Shona Simpson PA-C. I left my extension [phone] in case patient needs anything. Patient verbalized understanding.  Patient contact [phone]

## 2022-05-01 ENCOUNTER — Ambulatory Visit: Payer: No Typology Code available for payment source | Admitting: Plastic Surgery

## 2022-05-01 ENCOUNTER — Ambulatory Visit
Admission: RE | Admit: 2022-05-01 | Discharge: 2022-05-01 | Disposition: A | Discharge status: Home / Self Care | Payer: No Typology Code available for payment source | Source: Ambulatory Visit | Attending: Radiation Oncology | Admitting: Radiation Oncology

## 2022-05-01 ENCOUNTER — Ambulatory Visit: Payer: No Typology Code available for payment source

## 2022-05-01 DIAGNOSIS — C50411 Malignant neoplasm of upper-outer quadrant of right female breast: Secondary | ICD-10-CM

## 2022-05-02 ENCOUNTER — Ambulatory Visit (INDEPENDENT_AMBULATORY_CARE_PROVIDER_SITE_OTHER): Payer: No Typology Code available for payment source | Admitting: Physician Assistant

## 2022-05-02 DIAGNOSIS — Z170 Estrogen receptor positive status [ER+]: Secondary | ICD-10-CM

## 2022-05-02 DIAGNOSIS — C50411 Malignant neoplasm of upper-outer quadrant of right female breast: Secondary | ICD-10-CM

## 2022-05-02 NOTE — Progress Notes (Signed)
  This is a pleasant 41 year old female seen in our office for follow-up evaluation status post right oncoplastic breast reduction with left breast reduction for symmetry by Dr. Erin Hearing on 04/04/2022.  Postoperatively the patient has done very well.  She was most recently seen in our office on 04/10/2022 by Dr. Erin Hearing.  At that time she had minimal output from her Penrose drain on the right side so it was removed.  Her incisions were CDI with bilateral areolas viable.  Her pathology was reviewed by Dr. Claudia Desanctis although there was a positive margin on the cancer specimen he did remove additional tissue inferior to that as part of the reduction and no cancer was found.  Since her last office visit she has followed up with radiation oncology.  She notes that she does not require chemotherapy but will have 6 and half weeks of radiation therapy.  This will commence in approximately 2 weeks.   Today she notes she is doing well, she has 1 small wound at the inferior T-zone on the right side.  Otherwise she denies any complaints including fever, nausea or vomiting.  On exam she is well-appearing in no acute distress, she has 1 very minimal superficial wound at the right inferior T-zone with no surrounding redness, discharge or warmth.  Her breasts are soft with no palpable fluid collections.  NAC's are viable.   At this time I would recommend that the patient follow-up in our clinic prior to initiation of radiation therapy to assure that this wound has healed.  She will apply Vaseline to this wound.  If she develops any new or worsening signs or symptoms she will reach out immediately, she verbalized understanding and agreement to today's plan had no further questions or concerns at today's visit.

## 2022-05-06 ENCOUNTER — Other Ambulatory Visit: Payer: Self-pay

## 2022-05-06 ENCOUNTER — Ambulatory Visit
Admission: RE | Admit: 2022-05-06 | Discharge: 2022-05-06 | Disposition: A | Discharge status: Home / Self Care | Payer: No Typology Code available for payment source | Source: Ambulatory Visit | Attending: Radiation Oncology | Admitting: Radiation Oncology

## 2022-05-06 DIAGNOSIS — C50411 Malignant neoplasm of upper-outer quadrant of right female breast: Secondary | ICD-10-CM | POA: Diagnosis present

## 2022-05-07 ENCOUNTER — Encounter: Payer: Self-pay | Admitting: Physical Therapy

## 2022-05-07 ENCOUNTER — Ambulatory Visit: Payer: No Typology Code available for payment source | Admitting: Physical Therapy

## 2022-05-07 DIAGNOSIS — C50411 Malignant neoplasm of upper-outer quadrant of right female breast: Secondary | ICD-10-CM | POA: Diagnosis not present

## 2022-05-07 DIAGNOSIS — Z170 Estrogen receptor positive status [ER+]: Secondary | ICD-10-CM

## 2022-05-07 DIAGNOSIS — Z483 Aftercare following surgery for neoplasm: Secondary | ICD-10-CM

## 2022-05-07 DIAGNOSIS — R293 Abnormal posture: Secondary | ICD-10-CM

## 2022-05-07 NOTE — Therapy (Signed)
OUTPATIENT PHYSICAL THERAPY TREATMENT NOTE   Patient Name: Grace Harvey MRN: [id number] DOB:[birth year], 41 y.o., female Today's Date: 05/07/2022  PCP: Mayra Neer, MD REFERRING PROVIDER: Dr. Coralie Keens  END OF SESSION:   PT End of Session - 05/07/22 1407     Visit Number 4    Number of Visits 10    Date for PT Re-Evaluation 05/26/22    PT Start Time 1406    PT Stop Time 1453    PT Time Calculation (min) 47 min    Activity Tolerance Patient tolerated treatment well    Behavior During Therapy [facility] for tasks assessed/performed             Past Medical History:  Diagnosis Date   Cervical intraepithelial neoplasm HIGH GRADE   Family history of breast cancer 02/19/2022   Family history of prostate cancer 02/19/2022   Past Surgical History:  Procedure Laterality Date   BREAST LUMPECTOMY WITH RADIOACTIVE SEED AND SENTINEL LYMPH NODE BIOPSY Right 04/04/2022   Procedure: RIGHT BREAST LUMPECTOMY WITH RADIOACTIVE SEED AND SENTINEL LYMPH NODE BIOPSY;  Surgeon: Coralie Keens, MD;  Location: [place];  Service: General;  Laterality: Right;   BREAST REDUCTION SURGERY Bilateral 04/04/2022   Procedure: RIGHT SIDE ONCOPLASTIC BREAST REDUCTION, LEFT SIDE BREAST REDUCTION FOR SYMMETRY;  Surgeon: Cindra Presume, MD;  Location: [place];  Service: Plastics;  Laterality: Bilateral;   INTRAUTERINE DEVICE (IUD) INSERTION     mirena iud insertion 12-23-19   IUD REMOVAL  03/02/2012   MIRENA   laser vaporization of cervix  10/14/2011   RESET FX RIGHT ARM  AGE 62   WISDOM TOOTH EXTRACTION  03/04/2012   ORAL SURGEON OFFICE   Patient Active Problem List   Diagnosis Date Noted   Genetic testing 03/03/2022   Family history of breast cancer 02/19/2022   Family history of prostate cancer 02/19/2022   Malignant neoplasm of upper-outer quadrant of right breast in female, estrogen receptor positive ([place]) 02/17/2022   SVD 6/15 03/27/2018   Postpartum care  following vaginal delivery 03/27/2018   Second-degree perineal laceration, with delivery 03/27/2018    REFERRING DIAG: Right breast cancer  THERAPY DIAG:  Aftercare following surgery for neoplasm  Abnormal posture  Malignant neoplasm of upper-outer quadrant of right breast in female, estrogen receptor positive ([place])  Rationale for Evaluation and Treatment Rehabilitation  PERTINENT HISTORY: Patient was diagnosed on 02/10/2022 with right grade 2 invasive ductal carcinoma breast cancer. It measures 1.8 cm and is located in the upper outer quadrant. It is ER/PR positive and HER2 negative with a Ki67 of 30%.   PRECAUTIONS:  Recent Surgery, right UE Lymphedema risk  SUBJECTIVE: I am not nearly as tight as I was but I still feel like there is a problem with the scapula. It feels like it is being pulled up.   PAIN:  Are you having pain? No   OBJECTIVE: (objective measures completed at initial evaluation unless otherwise dated)   PATIENT SURVEYS:  QUICK DASH:   Quick Dash - 04/28/22 0001       Open a tight or new jar Mild difficulty     Do heavy household chores (wash walls, wash floors) Mild difficulty     Carry a shopping bag or briefcase No difficulty     Wash your back No difficulty     Use a knife to cut food No difficulty     Recreational activities in which you take some force or impact through  your arm, shoulder, or hand (golf, hammering, tennis) Unable     During the past week, to what extent has your arm, shoulder or hand problem interfered with your normal social activities with family, friends, neighbors, or groups? Slightly     During the past week, to what extent has your arm, shoulder or hand problem limited your work or other regular daily activities Slightly     Arm, shoulder, or hand pain. None     Tingling (pins and needles) in your arm, shoulder, or hand Mild     Difficulty Sleeping No difficulty     DASH Score 20.45 %                     OBSERVATIONS:             Bilateral breast incisions are healing well. There is edema present bilaterally. Small wound area present inferior right breast incision. Bilateral scapulae are both very tight with poor mobility, causing poor scapulohumeral rhythm. Right proximal lateral trunk cording suspected due to symptoms but not visible due to edema. Positive right UE neural tension.        POSTURE:  Forward head and rounded shoulders   LYMPHEDEMA ASSESSMENT:    UPPER EXTREMITY AROM/PROM:   A/PROM RIGHT  02/19/2022   RIGHT 04/28/2022 R 05/07/22  Shoulder extension 51 49 45  Shoulder flexion 152 127 166  Shoulder abduction 167 156 160  Shoulder internal rotation 78 75 71  Shoulder external rotation 84 86 85                          (Blank rows = not tested)   A/PROM LEFT  02/19/2022  Shoulder extension 48  Shoulder flexion 146  Shoulder abduction 170  Shoulder internal rotation 70  Shoulder external rotation 90                          (Blank rows = not tested)     CERVICAL AROM: All within normal limits   UPPER EXTREMITY STRENGTH: WNL     LYMPHEDEMA ASSESSMENTS:    LANDMARK RIGHT  02/19/2022 RIGHT 04/28/2022  10 cm proximal to olecranon process 29.5 29.3  Olecranon process 24.3 24.6  10 cm proximal to ulnar styloid process 22.3 21.6  Just proximal to ulnar styloid process 15.8 16.2  Across hand at thumb web space 18.7 18.5  At base of 2nd digit 6 6.2  (Blank rows = not tested)   LANDMARK LEFT  02/19/2022 LEFT 04/28/2022  10 cm proximal to olecranon process 29.5 29.9  Olecranon process 24.6 24.7  10 cm proximal to ulnar styloid process 20.8 21.2  Just proximal to ulnar styloid process 15.8 16.1  Across hand at thumb web space 17.6 17.9  At base of 2nd digit 6 6.1  (Blank rows = not tested)                    Surgery type/Date: Right lumpectomy, sentinel node biopsy, bil breast reduction 04/04/2022 Number of lymph nodes removed: 4 Current/past treatment (chemo, radiation, hormone  therapy): none Other symptoms:  Heaviness/tightness No Pain No Pitting edema No Infections Yes Decreased scar mobility Yes Stemmer sign No   Today's Treatment: 05/07/22: MFR: In supine to Rt lateral trunk and axilla at areas of tightness P/ROM: In Supine to Rt shoulder into flexion and abduction to pts tolerance Scap Mobs: In Lt S/L for  scap mobs into protraction and retraction STM: In Lt S/L with cocoa butter to medial and lateral scap border where trigger points palpable and UT where palpaby tight    04/29/22:  Manual Therapy MFR: In supine to Rt lateral trunk and axilla at areas of tightness P/ROM: In Supine to Rt shoulder into flexion, abd, and D2 to pts Scap Mobs: In Lt S/L for scap mobs into protraction and retraction, also scapular depression during P/ROM STM: In Lt S/L with cocoa butter to medial and lateral scap border where trigger points palpable and UT where palpaby tight  MLD: Towards end of session and ended session with Rt inguinal nodes, Rt axillo-inguinal anastomosis and lateral trunk over area of edema, and then Rt inguinals to complete At end of session issued nonadhesive dressing for pt to place at healing wound as this is still draining and pt was placing tissue here and this was pulling at scab     PATIENT EDUCATION:  Education details: HEP, lymphedema risk reduction Person educated: Patient Education method: Explanation, Demonstration, and Handouts Education comprehension: verbalized understanding and returned demonstration     HOME EXERCISE PROGRAM:            Reviewed previously given post op HEP. Issued closed chain shoulder flexion exercise.     ASSESSMENT:   CLINICAL IMPRESSION: Pt had some mild redness inferior to scar so did not perform MLD today just in case it is the beginning of infection. Educated pt to watch this area and let her doctor know if she notices any worsening. Continued with manual therapy to decrease tightness. Pt has had tingling  in her fingers and this was relieved follow soft tissue mobilization to scapular musculature.    Pt will benefit from skilled therapeutic intervention to improve on the following deficits: Decreased knowledge of precautions, impaired UE functional use, pain, decreased ROM, postural dysfunction.    PT treatment/interventions: ADL/Self care home management, Therapeutic exercises, Therapeutic activity, Patient/Family education, Self Care, Joint mobilization, Manual lymph drainage, scar mobilization, Manual therapy, and Re-evaluation         GOALS: Goals reviewed with patient? Yes   LONG TERM GOALS:  (STG=LTG)   GOALS Name Target Date   Goal status  1 Pt will demonstrate she has regained full shoulder ROM and function post operatively compared to baselines.    05/26/2022 IN PROGRESS  2 Patient will report >/= 25% reduction in feeling of cording in her right lateral trunk to tolerate improved shoulder abduction and radiation positioning. 05/26/2022 INITIAL  3 Patient will increase right shoulder active flexion to >/= 150 degrees for improved overall reach. 05/26/2022 INITIAL  4 Patient will reduce her DASH score to </= 3 for improved overall UE function. 05/26/2022 INITIAL        PLAN: PT FREQUENCY/DURATION: 2x/week for 4 weeks   PLAN FOR NEXT SESSION: Cont right lateral trunk and axillary myofascial release; Rt shoulder end motion stretches to incr MFR stretches and MLD along Rt lateral trunk  [employer], PT 05/07/22 3:00 PM  05/07/22 3:00 PM

## 2022-05-12 ENCOUNTER — Ambulatory Visit (INDEPENDENT_AMBULATORY_CARE_PROVIDER_SITE_OTHER): Payer: No Typology Code available for payment source | Admitting: Physician Assistant

## 2022-05-12 ENCOUNTER — Ambulatory Visit: Payer: No Typology Code available for payment source

## 2022-05-12 DIAGNOSIS — Z170 Estrogen receptor positive status [ER+]: Secondary | ICD-10-CM

## 2022-05-12 DIAGNOSIS — C50411 Malignant neoplasm of upper-outer quadrant of right female breast: Secondary | ICD-10-CM | POA: Diagnosis not present

## 2022-05-12 DIAGNOSIS — R293 Abnormal posture: Secondary | ICD-10-CM

## 2022-05-12 DIAGNOSIS — Z483 Aftercare following surgery for neoplasm: Secondary | ICD-10-CM

## 2022-05-12 NOTE — Therapy (Signed)
OUTPATIENT PHYSICAL THERAPY TREATMENT NOTE   Patient Name: Grace Harvey MRN: [id number] DOB:January 24, 1981, 41 y.o., female Today's Date: 05/12/2022  PCP: Mayra Neer, MD REFERRING PROVIDER: Dr. Coralie Keens  END OF SESSION:   PT End of Session - 05/12/22 1607     Visit Number 5    Number of Visits 10    Date for PT Re-Evaluation 05/26/22    PT Start Time 1605    PT Stop Time 1702    PT Time Calculation (min) 57 min    Activity Tolerance Patient tolerated treatment well    Behavior During Therapy [facility] for tasks assessed/performed             Past Medical History:  Diagnosis Date   Cervical intraepithelial neoplasm HIGH GRADE   Family history of breast cancer 02/19/2022   Family history of prostate cancer 02/19/2022   Past Surgical History:  Procedure Laterality Date   BREAST LUMPECTOMY WITH RADIOACTIVE SEED AND SENTINEL LYMPH NODE BIOPSY Right 04/04/2022   Procedure: RIGHT BREAST LUMPECTOMY WITH RADIOACTIVE SEED AND SENTINEL LYMPH NODE BIOPSY;  Surgeon: Coralie Keens, MD;  Location: [place];  Service: General;  Laterality: Right;   BREAST REDUCTION SURGERY Bilateral 04/04/2022   Procedure: RIGHT SIDE ONCOPLASTIC BREAST REDUCTION, LEFT SIDE BREAST REDUCTION FOR SYMMETRY;  Surgeon: Cindra Presume, MD;  Location: [place];  Service: Plastics;  Laterality: Bilateral;   INTRAUTERINE DEVICE (IUD) INSERTION     mirena iud insertion 12-23-19   IUD REMOVAL  03/02/2012   MIRENA   laser vaporization of cervix  10/14/2011   RESET FX RIGHT ARM  AGE 15   WISDOM TOOTH EXTRACTION  03/04/2012   ORAL SURGEON OFFICE   Patient Active Problem List   Diagnosis Date Noted   Genetic testing 03/03/2022   Family history of breast cancer 02/19/2022   Family history of prostate cancer 02/19/2022   Malignant neoplasm of upper-outer quadrant of right breast in female, estrogen receptor positive ([place]) 02/17/2022   SVD 6/15 03/27/2018   Postpartum care  following vaginal delivery 03/27/2018   Second-degree perineal laceration, with delivery 03/27/2018    REFERRING DIAG: Right breast cancer  THERAPY DIAG:  Aftercare following surgery for neoplasm  Abnormal posture  Malignant neoplasm of upper-outer quadrant of right breast in female, estrogen receptor positive ([place])  Rationale for Evaluation and Treatment Rehabilitation  PERTINENT HISTORY: Patient was diagnosed on 02/10/2022 with right grade 2 invasive ductal carcinoma breast cancer. It measures 1.8 cm and is located in the upper outer quadrant. It is ER/PR positive and HER2 negative with a Ki67 of 30%.   PRECAUTIONS:  Recent Surgery, right UE Lymphedema risk  SUBJECTIVE: I just saw the plastic surgeon today and he okayed me to start radiation Thursday. I'm nervous about starting so soon but I'll be glad to get things underway. There was a small stitch poking through my skin that he pulled out today so that spot is just a little red. I currently have some swelling behind my incision that I'd love for you to work on.   PAIN:  Are you having pain? No   OBJECTIVE: (objective measures completed at initial evaluation unless otherwise dated)   PATIENT SURVEYS:  QUICK DASH:   Quick Dash - 04/28/22 0001       Open a tight or new jar Mild difficulty     Do heavy household chores (wash walls, wash floors) Mild difficulty     Carry a shopping bag or briefcase  No difficulty     Wash your back No difficulty     Use a knife to cut food No difficulty     Recreational activities in which you take some force or impact through your arm, shoulder, or hand (golf, hammering, tennis) Unable     During the past week, to what extent has your arm, shoulder or hand problem interfered with your normal social activities with family, friends, neighbors, or groups? Slightly     During the past week, to what extent has your arm, shoulder or hand problem limited your work or other regular daily activities  Slightly     Arm, shoulder, or hand pain. None     Tingling (pins and needles) in your arm, shoulder, or hand Mild     Difficulty Sleeping No difficulty     DASH Score 20.45 %                     OBSERVATIONS:            Bilateral breast incisions are healing well. There is edema present bilaterally. Small wound area present inferior right breast incision. Bilateral scapulae are both very tight with poor mobility, causing poor scapulohumeral rhythm. Right proximal lateral trunk cording suspected due to symptoms but not visible due to edema. Positive right UE neural tension.        POSTURE:  Forward head and rounded shoulders   LYMPHEDEMA ASSESSMENT:    UPPER EXTREMITY AROM/PROM:   A/PROM RIGHT  02/19/2022   RIGHT 04/28/2022 R 05/07/22  Shoulder extension 51 49 45  Shoulder flexion 152 127 166  Shoulder abduction 167 156 160  Shoulder internal rotation 78 75 71  Shoulder external rotation 84 86 85                          (Blank rows = not tested)   A/PROM LEFT  02/19/2022  Shoulder extension 48  Shoulder flexion 146  Shoulder abduction 170  Shoulder internal rotation 70  Shoulder external rotation 90                          (Blank rows = not tested)     CERVICAL AROM: All within normal limits   UPPER EXTREMITY STRENGTH: WNL     LYMPHEDEMA ASSESSMENTS:    LANDMARK RIGHT  02/19/2022 RIGHT 04/28/2022  10 cm proximal to olecranon process 29.5 29.3  Olecranon process 24.3 24.6  10 cm proximal to ulnar styloid process 22.3 21.6  Just proximal to ulnar styloid process 15.8 16.2  Across hand at thumb web space 18.7 18.5  At base of 2nd digit 6 6.2  (Blank rows = not tested)   LANDMARK LEFT  02/19/2022 LEFT 04/28/2022  10 cm proximal to olecranon process 29.5 29.9  Olecranon process 24.6 24.7  10 cm proximal to ulnar styloid process 20.8 21.2  Just proximal to ulnar styloid process 15.8 16.1  Across hand at thumb web space 17.6 17.9  At base of 2nd digit 6 6.1   (Blank rows = not tested)                    Surgery type/Date: Right lumpectomy, sentinel node biopsy, bil breast reduction 04/04/2022 Number of lymph nodes removed: 4 Current/past treatment (chemo, radiation, hormone therapy): none Other symptoms:  Heaviness/tightness No Pain No Pitting edema No Infections Yes Decreased scar mobility Yes Stemmer sign  No   Today's Treatment: 05/12/22: Manual Therapy MLD: In Supine: Short neck, 5 diaphragmatic breaths, Rt inguinal nodes, and Rt axillo-inguinal anastomosis focusing on lateral aspect of breast but avoiding incision, especially redden area where surgeon just pulled stitch out MFR: To Rt axilla and upper arm where tightness palpable but can no longer palpate cording P/ROM: To Rt shoulder into flexion, abd, and D2 to pts available end motions  05/07/22: MFR: In supine to Rt lateral trunk and axilla at areas of tightness P/ROM: In Supine to Rt shoulder into flexion and abduction to pts tolerance Scap Mobs: In Lt S/L for scap mobs into protraction and retraction STM: In Lt S/L with cocoa butter to medial and lateral scap border where trigger points palpable and UT where palpaby tight    04/29/22:  Manual Therapy MFR: In supine to Rt lateral trunk and axilla at areas of tightness P/ROM: In Supine to Rt shoulder into flexion, abd, and D2 to pts Scap Mobs: In Lt S/L for scap mobs into protraction and retraction, also scapular depression during P/ROM STM: In Lt S/L with cocoa butter to medial and lateral scap border where trigger points palpable and UT where palpaby tight  MLD: Towards end of session and ended session with Rt inguinal nodes, Rt axillo-inguinal anastomosis and lateral trunk over area of edema, and then Rt inguinals to complete At end of session issued nonadhesive dressing for pt to place at healing wound as this is still draining and pt was placing tissue here and this was pulling at scab     PATIENT EDUCATION:  Education  details: HEP, lymphedema risk reduction Person educated: Patient Education method: Explanation, Demonstration, and Handouts Education comprehension: verbalized understanding and returned demonstration     HOME EXERCISE PROGRAM:            Reviewed previously given post op HEP. Issued closed chain shoulder flexion exercise.     ASSESSMENT:   CLINICAL IMPRESSION: Continued with manual therapy today and resumed with MLD as well but avoided area of redness where pt reports her surgeon pulled a stitch out before coming here today. Her cording is much improved as evidenced by it not being palpable today and only feeling residual tightness in her upper arm to axilla. Pt reports her A/ROM feeling much improved sine starting physical therapy along with cording being much less restricting.    Pt will benefit from skilled therapeutic intervention to improve on the following deficits: Decreased knowledge of precautions, impaired UE functional use, pain, decreased ROM, postural dysfunction.    PT treatment/interventions: ADL/Self care home management, Therapeutic exercises, Therapeutic activity, Patient/Family education, Self Care, Joint mobilization, Manual lymph drainage, scar mobilization, Manual therapy, and Re-evaluation         GOALS: Goals reviewed with patient? Yes   LONG TERM GOALS:  (STG=LTG)   GOALS Name Target Date   Goal status  1 Pt will demonstrate she has regained full shoulder ROM and function post operatively compared to baselines.    05/26/2022 IN PROGRESS  2 Patient will report >/= 25% reduction in feeling of cording in her right lateral trunk to tolerate improved shoulder abduction and radiation positioning. 05/26/2022 INITIAL  3 Patient will increase right shoulder active flexion to >/= 150 degrees for improved overall reach. 05/26/2022 INITIAL  4 Patient will reduce her DASH score to </= 3 for improved overall UE function. 05/26/2022 INITIAL        PLAN: PT  FREQUENCY/DURATION: 2x/week for 4 weeks   PLAN FOR  NEXT SESSION: Cont right lateral trunk and axillary myofascial release; Rt shoulder end motion stretches to incr MFR stretches and MLD along Rt lateral trunk  Collie Siad, PTA 05/12/22 5:10 PM

## 2022-05-12 NOTE — Progress Notes (Signed)
This is a pleasant 41 year old female seen in our office for follow-up evaluation status post right oncoplastic breast reduction with left breast reduction for symmetry by Dr. Claudia Desanctis on 04/04/2022.  Postoperatively the patient has done very well.  She was most recently seen in our office on 04/10/2022 by Dr. Claudia Desanctis.  At that time she had minimal output from her Penrose drain on the right side so it was removed.  Her incisions were CDI with bilateral areolas viable.  Her pathology was reviewed by Dr. Claudia Desanctis although there was a positive margin on the cancer specimen he did remove additional tissue inferior to that as part of the reduction and no cancer was found.  At her last office visit she noted that she would be starting radiation therapy shortly.  She also had a small very minimal superficial wound in the right inferior T-zone with no surrounding signs of infection.  We did recommend she follow-up in our office today for follow-up evaluation prior to proceeding with radiation therapy.  Since her last office visit she notes she has been doing very well, she has been using Vaseline on her inferior T-zone wound which she notes has been improving.  She does note that a staple is coming through the right lateral incision.  On exam she is well-appearing no acute distress.  She has 1 very minimal superficial wound at the right inferior T-zone with no surrounding redness, warmth, discharge.  She has an In-Sorb staple at the right lateral incision that is coming through as well as one superior and lateral to that.  Her right breast is soft with no fluid collections.  NAC viable.    Overall the patient appears to be doing very well, that the wound in the inferior T-zone has significantly improved and is very superficial.  At this time she is stable to start radiation therapy.  She is instructed to return immediately if she develops any new or worsening signs or symptoms.  The patient verbalized understanding and  agreement to today's plan had no further questions or concerns.

## 2022-05-13 ENCOUNTER — Encounter: Payer: Self-pay | Admitting: *Deleted

## 2022-05-15 ENCOUNTER — Other Ambulatory Visit: Payer: Self-pay

## 2022-05-15 ENCOUNTER — Ambulatory Visit
Admission: RE | Admit: 2022-05-15 | Discharge: 2022-05-15 | Disposition: A | Discharge status: Home / Self Care | Payer: No Typology Code available for payment source | Source: Ambulatory Visit | Attending: Radiation Oncology | Admitting: Radiation Oncology

## 2022-05-15 DIAGNOSIS — C50411 Malignant neoplasm of upper-outer quadrant of right female breast: Secondary | ICD-10-CM | POA: Diagnosis present

## 2022-05-15 DIAGNOSIS — Z170 Estrogen receptor positive status [ER+]: Secondary | ICD-10-CM | POA: Diagnosis present

## 2022-05-15 LAB — RAD ONC ARIA SESSION SUMMARY
Course Elapsed Days: 0
Plan Fractions Treated to Date: 1
Plan Prescribed Dose Per Fraction: 1.8 Gy
Plan Total Fractions Prescribed: 28
Plan Total Prescribed Dose: 50.4 Gy
Reference Point Dosage Given to Date: 1.8 Gy
Reference Point Session Dosage Given: 1.8 Gy
Session Number: 1

## 2022-05-15 NOTE — Progress Notes (Signed)
Pt here for patient teaching.  Pt given Radiation and You booklet, skin care instructions, alra deodorant and Radiaplex.    Reviewed areas of pertinence such as fatigue, hair loss, skin changes, breast tenderness, and breast swelling. Pt able to give teach back of to pat skin and use unscented/gentle soap,apply Radiaplex bid, avoid applying anything to skin within 4 hours of treatment, avoid wearing an under wire bra, and to use an electric razor if they must shave. Pt verbalizes understanding of information given and will contact nursing with any questions or concerns.    Daeshaun Specht M. Shakirra Buehler RN, BSN  

## 2022-05-16 ENCOUNTER — Ambulatory Visit
Admission: RE | Admit: 2022-05-16 | Discharge: 2022-05-16 | Disposition: A | Discharge status: Home / Self Care | Payer: No Typology Code available for payment source | Source: Ambulatory Visit | Attending: Radiation Oncology | Admitting: Radiation Oncology

## 2022-05-16 ENCOUNTER — Other Ambulatory Visit: Payer: Self-pay

## 2022-05-16 ENCOUNTER — Other Ambulatory Visit (HOSPITAL_COMMUNITY): Payer: Self-pay

## 2022-05-16 DIAGNOSIS — C50411 Malignant neoplasm of upper-outer quadrant of right female breast: Secondary | ICD-10-CM | POA: Diagnosis not present

## 2022-05-16 DIAGNOSIS — Z170 Estrogen receptor positive status [ER+]: Secondary | ICD-10-CM

## 2022-05-16 LAB — RAD ONC ARIA SESSION SUMMARY
Course Elapsed Days: 1
Plan Fractions Treated to Date: 2
Plan Prescribed Dose Per Fraction: 1.8 Gy
Plan Total Fractions Prescribed: 28
Plan Total Prescribed Dose: 50.4 Gy
Reference Point Dosage Given to Date: 3.6 Gy
Reference Point Session Dosage Given: 1.8 Gy
Session Number: 2

## 2022-05-16 MED ORDER — ALRA NON-METALLIC DEODORANT (RAD-ONC)
1.0000 | Freq: Once | TOPICAL | Status: AC
  Administered 2022-05-16: 1 via TOPICAL

## 2022-05-16 MED ORDER — VENLAFAXINE HCL ER 75 MG PO CP24
ORAL_CAPSULE | ORAL | 3 refills | Status: DC
  Filled 2022-05-16: qty 30, 30d supply, fill #0
  Filled 2022-06-13: qty 30, 30d supply, fill #1
  Filled 2022-07-13: qty 30, 30d supply, fill #2
  Filled 2022-08-12: qty 30, 30d supply, fill #3

## 2022-05-16 MED ORDER — RADIAPLEXRX EX GEL: Freq: Once | CUTANEOUS | Status: AC

## 2022-05-19 ENCOUNTER — Other Ambulatory Visit: Payer: Self-pay

## 2022-05-19 ENCOUNTER — Ambulatory Visit
Admission: RE | Admit: 2022-05-19 | Discharge: 2022-05-19 | Disposition: A | Discharge status: Home / Self Care | Payer: No Typology Code available for payment source | Source: Ambulatory Visit | Attending: Radiation Oncology | Admitting: Radiation Oncology

## 2022-05-19 DIAGNOSIS — C50411 Malignant neoplasm of upper-outer quadrant of right female breast: Secondary | ICD-10-CM | POA: Diagnosis not present

## 2022-05-19 LAB — RAD ONC ARIA SESSION SUMMARY
Course Elapsed Days: 4
Plan Fractions Treated to Date: 3
Plan Prescribed Dose Per Fraction: 1.8 Gy
Plan Total Fractions Prescribed: 28
Plan Total Prescribed Dose: 50.4 Gy
Reference Point Dosage Given to Date: 5.4 Gy
Reference Point Session Dosage Given: 1.8 Gy
Session Number: 3

## 2022-05-20 ENCOUNTER — Ambulatory Visit
Admission: RE | Admit: 2022-05-20 | Discharge: 2022-05-20 | Disposition: A | Discharge status: Home / Self Care | Payer: No Typology Code available for payment source | Source: Ambulatory Visit | Attending: Radiation Oncology | Admitting: Radiation Oncology

## 2022-05-20 ENCOUNTER — Other Ambulatory Visit: Payer: Self-pay

## 2022-05-20 DIAGNOSIS — C50411 Malignant neoplasm of upper-outer quadrant of right female breast: Secondary | ICD-10-CM | POA: Diagnosis not present

## 2022-05-20 LAB — RAD ONC ARIA SESSION SUMMARY
Course Elapsed Days: 5
Plan Fractions Treated to Date: 4
Plan Prescribed Dose Per Fraction: 1.8 Gy
Plan Total Fractions Prescribed: 28
Plan Total Prescribed Dose: 50.4 Gy
Reference Point Dosage Given to Date: 7.2 Gy
Reference Point Session Dosage Given: 1.8 Gy
Session Number: 4

## 2022-05-21 ENCOUNTER — Ambulatory Visit
Admission: RE | Admit: 2022-05-21 | Discharge: 2022-05-21 | Disposition: A | Discharge status: Home / Self Care | Payer: No Typology Code available for payment source | Source: Ambulatory Visit | Attending: Radiation Oncology | Admitting: Radiation Oncology

## 2022-05-21 ENCOUNTER — Other Ambulatory Visit: Payer: Self-pay

## 2022-05-21 DIAGNOSIS — C50411 Malignant neoplasm of upper-outer quadrant of right female breast: Secondary | ICD-10-CM | POA: Diagnosis not present

## 2022-05-21 LAB — RAD ONC ARIA SESSION SUMMARY
Course Elapsed Days: 6
Plan Fractions Treated to Date: 5
Plan Prescribed Dose Per Fraction: 1.8 Gy
Plan Total Fractions Prescribed: 28
Plan Total Prescribed Dose: 50.4 Gy
Reference Point Dosage Given to Date: 9 Gy
Reference Point Session Dosage Given: 1.8 Gy
Session Number: 5

## 2022-05-22 ENCOUNTER — Ambulatory Visit
Admission: RE | Admit: 2022-05-22 | Discharge: 2022-05-22 | Disposition: A | Discharge status: Home / Self Care | Payer: No Typology Code available for payment source | Source: Ambulatory Visit | Attending: Radiation Oncology | Admitting: Radiation Oncology

## 2022-05-22 ENCOUNTER — Other Ambulatory Visit: Payer: Self-pay

## 2022-05-22 DIAGNOSIS — C50411 Malignant neoplasm of upper-outer quadrant of right female breast: Secondary | ICD-10-CM | POA: Diagnosis not present

## 2022-05-22 LAB — RAD ONC ARIA SESSION SUMMARY
Course Elapsed Days: 7
Plan Fractions Treated to Date: 6
Plan Prescribed Dose Per Fraction: 1.8 Gy
Plan Total Fractions Prescribed: 28
Plan Total Prescribed Dose: 50.4 Gy
Reference Point Dosage Given to Date: 10.8 Gy
Reference Point Session Dosage Given: 1.8 Gy
Session Number: 6

## 2022-05-23 ENCOUNTER — Ambulatory Visit
Admission: RE | Admit: 2022-05-23 | Discharge: 2022-05-23 | Disposition: A | Discharge status: Home / Self Care | Payer: No Typology Code available for payment source | Source: Ambulatory Visit | Attending: Radiation Oncology | Admitting: Radiation Oncology

## 2022-05-23 ENCOUNTER — Other Ambulatory Visit: Payer: Self-pay

## 2022-05-23 DIAGNOSIS — C50411 Malignant neoplasm of upper-outer quadrant of right female breast: Secondary | ICD-10-CM | POA: Diagnosis not present

## 2022-05-23 LAB — RAD ONC ARIA SESSION SUMMARY
Course Elapsed Days: 8
Plan Fractions Treated to Date: 7
Plan Prescribed Dose Per Fraction: 1.8 Gy
Plan Total Fractions Prescribed: 28
Plan Total Prescribed Dose: 50.4 Gy
Reference Point Dosage Given to Date: 12.6 Gy
Reference Point Session Dosage Given: 1.8 Gy
Session Number: 7

## 2022-05-26 ENCOUNTER — Other Ambulatory Visit: Payer: Self-pay

## 2022-05-26 ENCOUNTER — Ambulatory Visit
Admission: RE | Admit: 2022-05-26 | Discharge: 2022-05-26 | Disposition: A | Discharge status: Home / Self Care | Payer: No Typology Code available for payment source | Source: Ambulatory Visit | Attending: Radiation Oncology | Admitting: Radiation Oncology

## 2022-05-26 DIAGNOSIS — C50411 Malignant neoplasm of upper-outer quadrant of right female breast: Secondary | ICD-10-CM | POA: Diagnosis not present

## 2022-05-26 LAB — RAD ONC ARIA SESSION SUMMARY
Course Elapsed Days: 11
Plan Fractions Treated to Date: 8
Plan Prescribed Dose Per Fraction: 1.8 Gy
Plan Total Fractions Prescribed: 28
Plan Total Prescribed Dose: 50.4 Gy
Reference Point Dosage Given to Date: 14.4 Gy
Reference Point Session Dosage Given: 1.8 Gy
Session Number: 8

## 2022-05-27 ENCOUNTER — Other Ambulatory Visit: Payer: Self-pay | Admitting: Radiation Oncology

## 2022-05-27 ENCOUNTER — Other Ambulatory Visit: Payer: Self-pay

## 2022-05-27 ENCOUNTER — Other Ambulatory Visit (HOSPITAL_COMMUNITY): Payer: Self-pay

## 2022-05-27 ENCOUNTER — Ambulatory Visit
Admission: RE | Admit: 2022-05-27 | Discharge: 2022-05-27 | Disposition: A | Discharge status: Home / Self Care | Payer: No Typology Code available for payment source | Source: Ambulatory Visit | Attending: Radiation Oncology | Admitting: Radiation Oncology

## 2022-05-27 DIAGNOSIS — C50411 Malignant neoplasm of upper-outer quadrant of right female breast: Secondary | ICD-10-CM | POA: Diagnosis not present

## 2022-05-27 LAB — RAD ONC ARIA SESSION SUMMARY
Course Elapsed Days: 12
Plan Fractions Treated to Date: 9
Plan Prescribed Dose Per Fraction: 1.8 Gy
Plan Total Fractions Prescribed: 28
Plan Total Prescribed Dose: 50.4 Gy
Reference Point Dosage Given to Date: 16.2 Gy
Reference Point Session Dosage Given: 1.8 Gy
Session Number: 9

## 2022-05-27 MED ORDER — SULFAMETHOXAZOLE-TRIMETHOPRIM 800-160 MG PO TABS
1.0000 | ORAL_TABLET | Freq: Two times a day (BID) | ORAL | 0 refills | Status: DC
  Filled 2022-05-27: qty 14, 7d supply, fill #0

## 2022-05-27 NOTE — Progress Notes (Signed)
Ms. Grace Harvey is a pleasant 41 y.o. woman currently receiving right breast radiation for Stage IA, [id number], grade 2, ER/PR positive invasive ductal carcinoma of the right breast. She had postoperative cellulitis following her oncoplastic reduction required a course of Keflex, and persistence requiring Doxycycline. She has received 8 of the planned 33 fractions of radiation to the right breast, and over the weekend was in a pool, and since, has developed erythema, and warmth along the same location as her previous infection.   On exam she has erythema of the lower outer quadrant of the right breast that is warm to the touch, and the tissue is edematous. There are some slight elevated ridges of the lower aspect of her incision site, without punctate changes. Photo pictured below. There was mild fluctuance deep to the skin, but no drainage from the incision with palpation. Borders were drawn with a sharpie marker.     Impression/Plan: Cellulitis. The patient appears to have clinical findings of cellulitis. We will start Bactrim DS to cover for penicillin resistant bacteria. A new prescription was sent to her pharmacy. I will also reach out to both of her surgeons. We plan to proceed with radiation as planned.      Carola Rhine, PAC

## 2022-05-28 ENCOUNTER — Other Ambulatory Visit: Payer: Self-pay

## 2022-05-28 ENCOUNTER — Ambulatory Visit
Admission: RE | Admit: 2022-05-28 | Discharge: 2022-05-28 | Disposition: A | Discharge status: Home / Self Care | Payer: No Typology Code available for payment source | Source: Ambulatory Visit | Attending: Radiation Oncology | Admitting: Radiation Oncology

## 2022-05-28 DIAGNOSIS — C50411 Malignant neoplasm of upper-outer quadrant of right female breast: Secondary | ICD-10-CM | POA: Diagnosis not present

## 2022-05-28 LAB — RAD ONC ARIA SESSION SUMMARY
Course Elapsed Days: 13
Plan Fractions Treated to Date: 10
Plan Prescribed Dose Per Fraction: 1.8 Gy
Plan Total Fractions Prescribed: 28
Plan Total Prescribed Dose: 50.4 Gy
Reference Point Dosage Given to Date: 18 Gy
Reference Point Session Dosage Given: 0.9 Gy
Session Number: 10

## 2022-05-29 ENCOUNTER — Ambulatory Visit: Payer: No Typology Code available for payment source

## 2022-05-30 ENCOUNTER — Other Ambulatory Visit: Payer: Self-pay

## 2022-05-30 ENCOUNTER — Ambulatory Visit
Admission: RE | Admit: 2022-05-30 | Discharge: 2022-05-30 | Disposition: A | Discharge status: Home / Self Care | Payer: No Typology Code available for payment source | Source: Ambulatory Visit | Attending: Radiation Oncology | Admitting: Radiation Oncology

## 2022-05-30 DIAGNOSIS — C50411 Malignant neoplasm of upper-outer quadrant of right female breast: Secondary | ICD-10-CM | POA: Diagnosis not present

## 2022-05-30 LAB — RAD ONC ARIA SESSION SUMMARY
Course Elapsed Days: 15
Plan Fractions Treated to Date: 11
Plan Prescribed Dose Per Fraction: 1.8 Gy
Plan Total Fractions Prescribed: 28
Plan Total Prescribed Dose: 50.4 Gy
Reference Point Dosage Given to Date: 19.8 Gy
Reference Point Session Dosage Given: 1.8 Gy
Session Number: 11

## 2022-06-02 ENCOUNTER — Other Ambulatory Visit (HOSPITAL_COMMUNITY): Payer: Self-pay

## 2022-06-02 ENCOUNTER — Ambulatory Visit: Payer: No Typology Code available for payment source

## 2022-06-02 MED ORDER — SULFAMETHOXAZOLE-TRIMETHOPRIM 800-160 MG PO TABS
ORAL_TABLET | ORAL | 0 refills | Status: DC
Start: 2022-06-02 — End: 2022-06-23
  Filled 2022-06-02: qty 14, 7d supply, fill #0

## 2022-06-03 ENCOUNTER — Other Ambulatory Visit: Payer: Self-pay

## 2022-06-03 ENCOUNTER — Ambulatory Visit
Admission: RE | Admit: 2022-06-03 | Discharge: 2022-06-03 | Disposition: A | Discharge status: Home / Self Care | Payer: No Typology Code available for payment source | Source: Ambulatory Visit | Attending: Radiation Oncology | Admitting: Radiation Oncology

## 2022-06-03 DIAGNOSIS — C50411 Malignant neoplasm of upper-outer quadrant of right female breast: Secondary | ICD-10-CM | POA: Diagnosis not present

## 2022-06-03 LAB — RAD ONC ARIA SESSION SUMMARY
Course Elapsed Days: 19
Plan Fractions Treated to Date: 12
Plan Prescribed Dose Per Fraction: 1.8 Gy
Plan Total Fractions Prescribed: 28
Plan Total Prescribed Dose: 50.4 Gy
Reference Point Dosage Given to Date: 21.6 Gy
Reference Point Session Dosage Given: 1.8 Gy
Session Number: 12

## 2022-06-04 ENCOUNTER — Other Ambulatory Visit: Payer: Self-pay

## 2022-06-04 ENCOUNTER — Ambulatory Visit
Admission: RE | Admit: 2022-06-04 | Discharge: 2022-06-04 | Disposition: A | Discharge status: Home / Self Care | Payer: No Typology Code available for payment source | Source: Ambulatory Visit | Attending: Radiation Oncology | Admitting: Radiation Oncology

## 2022-06-04 DIAGNOSIS — C50411 Malignant neoplasm of upper-outer quadrant of right female breast: Secondary | ICD-10-CM | POA: Diagnosis not present

## 2022-06-04 LAB — RAD ONC ARIA SESSION SUMMARY
Course Elapsed Days: 20
Plan Fractions Treated to Date: 13
Plan Prescribed Dose Per Fraction: 1.8 Gy
Plan Total Fractions Prescribed: 28
Plan Total Prescribed Dose: 50.4 Gy
Reference Point Dosage Given to Date: 23.4 Gy
Reference Point Session Dosage Given: 1.8 Gy
Session Number: 13

## 2022-06-05 ENCOUNTER — Other Ambulatory Visit: Payer: Self-pay

## 2022-06-05 ENCOUNTER — Ambulatory Visit
Admission: RE | Admit: 2022-06-05 | Discharge: 2022-06-05 | Disposition: A | Discharge status: Home / Self Care | Payer: No Typology Code available for payment source | Source: Ambulatory Visit | Attending: Radiation Oncology | Admitting: Radiation Oncology

## 2022-06-05 DIAGNOSIS — C50411 Malignant neoplasm of upper-outer quadrant of right female breast: Secondary | ICD-10-CM | POA: Diagnosis not present

## 2022-06-05 LAB — RAD ONC ARIA SESSION SUMMARY
Course Elapsed Days: 21
Plan Fractions Treated to Date: 14
Plan Prescribed Dose Per Fraction: 1.8 Gy
Plan Total Fractions Prescribed: 28
Plan Total Prescribed Dose: 50.4 Gy
Reference Point Dosage Given to Date: 25.2 Gy
Reference Point Session Dosage Given: 1.8 Gy
Session Number: 14

## 2022-06-06 ENCOUNTER — Ambulatory Visit
Admission: RE | Admit: 2022-06-06 | Discharge: 2022-06-06 | Disposition: A | Discharge status: Home / Self Care | Payer: No Typology Code available for payment source | Source: Ambulatory Visit | Attending: Radiation Oncology | Admitting: Radiation Oncology

## 2022-06-06 ENCOUNTER — Other Ambulatory Visit: Payer: Self-pay

## 2022-06-06 ENCOUNTER — Ambulatory Visit: Payer: No Typology Code available for payment source

## 2022-06-06 DIAGNOSIS — C50411 Malignant neoplasm of upper-outer quadrant of right female breast: Secondary | ICD-10-CM | POA: Diagnosis not present

## 2022-06-06 LAB — RAD ONC ARIA SESSION SUMMARY
Course Elapsed Days: 22
Plan Fractions Treated to Date: 15
Plan Prescribed Dose Per Fraction: 1.8 Gy
Plan Total Fractions Prescribed: 28
Plan Total Prescribed Dose: 50.4 Gy
Reference Point Dosage Given to Date: 27 Gy
Reference Point Session Dosage Given: 1.8 Gy
Session Number: 15

## 2022-06-09 ENCOUNTER — Other Ambulatory Visit: Payer: Self-pay

## 2022-06-09 ENCOUNTER — Ambulatory Visit
Admission: RE | Admit: 2022-06-09 | Discharge: 2022-06-09 | Disposition: A | Discharge status: Home / Self Care | Payer: No Typology Code available for payment source | Source: Ambulatory Visit | Attending: Radiation Oncology | Admitting: Radiation Oncology

## 2022-06-09 DIAGNOSIS — C50411 Malignant neoplasm of upper-outer quadrant of right female breast: Secondary | ICD-10-CM | POA: Diagnosis not present

## 2022-06-09 LAB — RAD ONC ARIA SESSION SUMMARY
Course Elapsed Days: 25
Plan Fractions Treated to Date: 16
Plan Prescribed Dose Per Fraction: 1.8 Gy
Plan Total Fractions Prescribed: 28
Plan Total Prescribed Dose: 50.4 Gy
Reference Point Dosage Given to Date: 28.8 Gy
Reference Point Session Dosage Given: 1.8 Gy
Session Number: 16

## 2022-06-10 ENCOUNTER — Ambulatory Visit
Admission: RE | Admit: 2022-06-10 | Discharge: 2022-06-10 | Disposition: A | Discharge status: Home / Self Care | Payer: No Typology Code available for payment source | Source: Ambulatory Visit | Attending: Radiation Oncology | Admitting: Radiation Oncology

## 2022-06-10 ENCOUNTER — Other Ambulatory Visit: Payer: Self-pay

## 2022-06-10 DIAGNOSIS — C50411 Malignant neoplasm of upper-outer quadrant of right female breast: Secondary | ICD-10-CM | POA: Diagnosis not present

## 2022-06-10 LAB — RAD ONC ARIA SESSION SUMMARY
Course Elapsed Days: 26
Plan Fractions Treated to Date: 17
Plan Prescribed Dose Per Fraction: 1.8 Gy
Plan Total Fractions Prescribed: 28
Plan Total Prescribed Dose: 50.4 Gy
Reference Point Dosage Given to Date: 30.6 Gy
Reference Point Session Dosage Given: 1.8 Gy
Session Number: 17

## 2022-06-11 ENCOUNTER — Other Ambulatory Visit: Payer: Self-pay

## 2022-06-11 ENCOUNTER — Ambulatory Visit
Admission: RE | Admit: 2022-06-11 | Discharge: 2022-06-11 | Disposition: A | Discharge status: Home / Self Care | Payer: No Typology Code available for payment source | Source: Ambulatory Visit | Attending: Radiation Oncology | Admitting: Radiation Oncology

## 2022-06-11 DIAGNOSIS — C50411 Malignant neoplasm of upper-outer quadrant of right female breast: Secondary | ICD-10-CM | POA: Diagnosis not present

## 2022-06-11 LAB — RAD ONC ARIA SESSION SUMMARY
Course Elapsed Days: 27
Plan Fractions Treated to Date: 18
Plan Prescribed Dose Per Fraction: 1.8 Gy
Plan Total Fractions Prescribed: 28
Plan Total Prescribed Dose: 50.4 Gy
Reference Point Dosage Given to Date: 32.4 Gy
Reference Point Session Dosage Given: 1.8 Gy
Session Number: 18

## 2022-06-12 ENCOUNTER — Other Ambulatory Visit: Payer: Self-pay

## 2022-06-12 ENCOUNTER — Ambulatory Visit
Admission: RE | Admit: 2022-06-12 | Discharge: 2022-06-12 | Disposition: A | Discharge status: Home / Self Care | Payer: No Typology Code available for payment source | Source: Ambulatory Visit | Attending: Radiation Oncology | Admitting: Radiation Oncology

## 2022-06-12 DIAGNOSIS — C50411 Malignant neoplasm of upper-outer quadrant of right female breast: Secondary | ICD-10-CM | POA: Diagnosis not present

## 2022-06-12 LAB — RAD ONC ARIA SESSION SUMMARY
Course Elapsed Days: 28
Plan Fractions Treated to Date: 19
Plan Prescribed Dose Per Fraction: 1.8 Gy
Plan Total Fractions Prescribed: 28
Plan Total Prescribed Dose: 50.4 Gy
Reference Point Dosage Given to Date: 34.2 Gy
Reference Point Session Dosage Given: 1.8 Gy
Session Number: 19

## 2022-06-13 ENCOUNTER — Other Ambulatory Visit: Payer: Self-pay

## 2022-06-13 ENCOUNTER — Other Ambulatory Visit (HOSPITAL_COMMUNITY): Payer: Self-pay

## 2022-06-13 ENCOUNTER — Ambulatory Visit
Admission: RE | Admit: 2022-06-13 | Discharge: 2022-06-13 | Disposition: A | Discharge status: Home / Self Care | Payer: No Typology Code available for payment source | Source: Ambulatory Visit | Attending: Radiation Oncology | Admitting: Radiation Oncology

## 2022-06-13 DIAGNOSIS — Z170 Estrogen receptor positive status [ER+]: Secondary | ICD-10-CM

## 2022-06-13 DIAGNOSIS — C50411 Malignant neoplasm of upper-outer quadrant of right female breast: Secondary | ICD-10-CM | POA: Insufficient documentation

## 2022-06-13 DIAGNOSIS — Z510 Encounter for antineoplastic radiation therapy: Secondary | ICD-10-CM | POA: Diagnosis not present

## 2022-06-13 LAB — RAD ONC ARIA SESSION SUMMARY
Course Elapsed Days: 29
Plan Fractions Treated to Date: 20
Plan Prescribed Dose Per Fraction: 1.8 Gy
Plan Total Fractions Prescribed: 28
Plan Total Prescribed Dose: 50.4 Gy
Reference Point Dosage Given to Date: 36 Gy
Reference Point Session Dosage Given: 1.8 Gy
Session Number: 20

## 2022-06-13 MED ORDER — RADIAPLEXRX EX GEL: Freq: Once | CUTANEOUS | Status: AC

## 2022-06-17 ENCOUNTER — Other Ambulatory Visit: Payer: Self-pay

## 2022-06-17 ENCOUNTER — Ambulatory Visit
Admission: RE | Admit: 2022-06-17 | Discharge: 2022-06-17 | Disposition: A | Discharge status: Home / Self Care | Payer: No Typology Code available for payment source | Source: Ambulatory Visit | Attending: Radiation Oncology | Admitting: Radiation Oncology

## 2022-06-17 DIAGNOSIS — Z510 Encounter for antineoplastic radiation therapy: Secondary | ICD-10-CM | POA: Diagnosis not present

## 2022-06-17 LAB — RAD ONC ARIA SESSION SUMMARY
Course Elapsed Days: 33
Plan Fractions Treated to Date: 21
Plan Prescribed Dose Per Fraction: 1.8 Gy
Plan Total Fractions Prescribed: 28
Plan Total Prescribed Dose: 50.4 Gy
Reference Point Dosage Given to Date: 37.8 Gy
Reference Point Session Dosage Given: 1.8 Gy
Session Number: 21

## 2022-06-18 ENCOUNTER — Other Ambulatory Visit: Payer: Self-pay

## 2022-06-18 ENCOUNTER — Ambulatory Visit
Admission: RE | Admit: 2022-06-18 | Discharge: 2022-06-18 | Disposition: A | Discharge status: Home / Self Care | Payer: No Typology Code available for payment source | Source: Ambulatory Visit | Attending: Radiation Oncology | Admitting: Radiation Oncology

## 2022-06-18 DIAGNOSIS — Z510 Encounter for antineoplastic radiation therapy: Secondary | ICD-10-CM | POA: Diagnosis not present

## 2022-06-18 LAB — RAD ONC ARIA SESSION SUMMARY
Course Elapsed Days: 34
Plan Fractions Treated to Date: 22
Plan Prescribed Dose Per Fraction: 1.8 Gy
Plan Total Fractions Prescribed: 28
Plan Total Prescribed Dose: 50.4 Gy
Reference Point Dosage Given to Date: 39.6 Gy
Reference Point Session Dosage Given: 1.8 Gy
Session Number: 22

## 2022-06-19 ENCOUNTER — Ambulatory Visit
Admission: RE | Admit: 2022-06-19 | Discharge: 2022-06-19 | Disposition: A | Discharge status: Home / Self Care | Payer: No Typology Code available for payment source | Source: Ambulatory Visit | Attending: Radiation Oncology | Admitting: Radiation Oncology

## 2022-06-19 ENCOUNTER — Other Ambulatory Visit: Payer: Self-pay

## 2022-06-19 DIAGNOSIS — Z510 Encounter for antineoplastic radiation therapy: Secondary | ICD-10-CM | POA: Diagnosis not present

## 2022-06-19 LAB — RAD ONC ARIA SESSION SUMMARY
Course Elapsed Days: 35
Plan Fractions Treated to Date: 23
Plan Prescribed Dose Per Fraction: 1.8 Gy
Plan Total Fractions Prescribed: 28
Plan Total Prescribed Dose: 50.4 Gy
Reference Point Dosage Given to Date: 41.4 Gy
Reference Point Session Dosage Given: 1.8 Gy
Session Number: 23

## 2022-06-19 NOTE — Progress Notes (Signed)
Patient Care Team: Mayra Neer, MD as PCP - General (Family Medicine) Coralie Keens, MD as Consulting Physician (General Surgery) Nicholas Lose, MD as Consulting Physician (Hematology and Oncology) Kyung Rudd, MD as Consulting Physician (Radiation Oncology) Mauro Kaufmann, RN as Oncology Nurse Navigator Rockwell Germany, RN as Oncology Nurse Navigator  DIAGNOSIS: No diagnosis found.  SUMMARY OF ONCOLOGIC HISTORY: Oncology History  Malignant neoplasm of upper-outer quadrant of right breast in female, estrogen receptor positive ([place])  02/10/2022 Initial Diagnosis   Palpable right breast mass UOQ spiculated mass at 10 o'clock position 1.8 cm, axilla negative, ultrasound-guided biopsy: Grade 2 IDC, ER 100%, PR 100%, HER2 equivocal by IHC, FISH negative, Ki-67 30%   02/19/2022 Cancer Staging   Staging form: Breast, AJCC 8th Edition - Clinical: Stage IA (cT1c, cN0, cM0, G2, ER+, PR+, HER2-) - Signed by Nicholas Lose, MD on 02/19/2022 Stage prefix: Initial diagnosis Histologic grading system: 3 grade system   02/28/2022 Genetic Testing   Negative hereditary cancer genetic testing: no pathogenic variants detected in [employer] Custom[employer].  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  Report dates are 02/28/2022 and Mar 04, 2022.   The CustomNext-Cancer+RNAinsight panel offered by [employer] [facility] includes sequencing and rearrangement analysis for the following 47 genes:  APC, ATM, AXIN2, BARD1, BMPR1A, BRCA1, BRCA2, BRIP1, CDH1, CDK4, CDKN2A, CHEK2, DICER1, EPCAM, GREM1, HOXB13, MEN1, MLH1, MSH2, MSH3, MSH6, MUTYH, NBN, NF1, NF2, NTHL1, PALB2, PMS2, POLD1, POLE, PTEN, RAD51C, RAD51D, RECQL, RET, SDHA, SDHAF2, SDHB, SDHC, SDHD, SMAD4, SMARCA4, STK11, TP53, TSC1, TSC2, and VHL.  RNA data is routinely analyzed for use in variant interpretation for all genes.   04/04/2022 Surgery   Right lumpectomy: Grade 2 IDC 3.7 cm, with DCIS intermediate grade, 0/4 lymph nodes  negative, inferior margin was removed through mammoplasty and therefore all margins are clear.  ER 100%, PR 100%, Ki-67 equivocal by IHC negative by FISH, Ki-67 30%     CHIEF COMPLIANT: Right breast cancer status postlumpectomy    INTERVAL HISTORY: Grace Harvey is a  41 y.o. female is here because of recent diagnosis of right breast cancer. She presents to the clinic today for a follow-up after recent right lumpectomy and bilateral breast reductions.  ALLERGIES:  has No Known Allergies.  MEDICATIONS:  Current Outpatient Medications  Medication Sig Dispense Refill   sertraline (ZOLOFT) 50 MG tablet Take 1 & 1/2 tablet by mouth once a day 135 tablet 0   sulfamethoxazole-trimethoprim (BACTRIM DS) 800-160 MG tablet Take 1 tablet by mouth 2 times daily for 7 days 14 tablet 0   venlafaxine XR (EFFEXOR-XR) 37.5 MG 24 hr capsule Take 1 capsule by mouth with food once a day 30 capsule 3   venlafaxine XR (EFFEXOR-XR) 75 MG 24 hr capsule Take 1 capsule by mouth with food daily. 30 capsule 3   No current facility-administered medications for this visit.    PHYSICAL EXAMINATION: ECOG PERFORMANCE STATUS: {CHL ONC ECOG PS:[phone]}  There were no vitals filed for this visit. There were no vitals filed for this visit.  BREAST:*** No palpable masses or nodules in either right or left breasts. No palpable axillary supraclavicular or infraclavicular adenopathy no breast tenderness or nipple discharge. (exam performed in the presence of a chaperone)  LABORATORY DATA:  I have reviewed the data as listed    Latest Ref Rng & Units 02/19/2022   12:12 PM 06/11/2015   11:17 AM  CMP  Glucose 70 - 99 mg/dL 98  85  BUN 6 - 20 mg/dL 19  11   Creatinine 0.44 - 1.00 mg/dL 0.74  0.73   Sodium 135 - 145 mmol/L 137  141   Potassium 3.5 - 5.1 mmol/L 3.8  4.3   Chloride 98 - 111 mmol/L 106  105   CO2 22 - 32 mmol/L 24  26   Calcium 8.9 - 10.3 mg/dL 9.3  9.2   Total Protein 6.5 - 8.1 g/dL 7.6  7.0    Total Bilirubin 0.3 - 1.2 mg/dL 0.5  0.5   Alkaline Phos 38 - 126 U/L 45  46   AST 15 - 41 U/L 12  12   ALT 0 - 44 U/L 14  9     Lab Results  Component Value Date   WBC 6.9 02/19/2022   HGB 14.5 02/19/2022   HCT 42.6 02/19/2022   MCV 88.2 02/19/2022   PLT 306 02/19/2022   NEUTROABS 4.0 02/19/2022    ASSESSMENT & PLAN:  No problem-specific Assessment & Plan notes found for this encounter.    No orders of the defined types were placed in this encounter.  The patient has a good understanding of the overall plan. she agrees with it. she will call with any problems that may develop before the next visit here. Total time spent: 30 mins including face to face time and time spent for planning, charting and co-ordination of care   Suzzette Righter, [place] 06/19/22    I Gardiner Coins am scribing for Dr. Lindi Adie  ***

## 2022-06-20 ENCOUNTER — Ambulatory Visit: Payer: No Typology Code available for payment source | Admitting: Radiation Oncology

## 2022-06-20 ENCOUNTER — Ambulatory Visit
Admission: RE | Admit: 2022-06-20 | Discharge: 2022-06-20 | Disposition: A | Discharge status: Home / Self Care | Payer: No Typology Code available for payment source | Source: Ambulatory Visit | Attending: Radiation Oncology | Admitting: Radiation Oncology

## 2022-06-20 ENCOUNTER — Other Ambulatory Visit: Payer: Self-pay

## 2022-06-20 DIAGNOSIS — Z510 Encounter for antineoplastic radiation therapy: Secondary | ICD-10-CM | POA: Diagnosis not present

## 2022-06-20 LAB — RAD ONC ARIA SESSION SUMMARY
Course Elapsed Days: 36
Plan Fractions Treated to Date: 24
Plan Prescribed Dose Per Fraction: 1.8 Gy
Plan Total Fractions Prescribed: 28
Plan Total Prescribed Dose: 50.4 Gy
Reference Point Dosage Given to Date: 43.2 Gy
Reference Point Session Dosage Given: 1.8 Gy
Session Number: 24

## 2022-06-23 ENCOUNTER — Other Ambulatory Visit: Payer: Self-pay

## 2022-06-23 ENCOUNTER — Ambulatory Visit
Admission: RE | Admit: 2022-06-23 | Discharge: 2022-06-23 | Disposition: A | Discharge status: Home / Self Care | Payer: No Typology Code available for payment source | Source: Ambulatory Visit | Attending: Radiation Oncology | Admitting: Radiation Oncology

## 2022-06-23 ENCOUNTER — Other Ambulatory Visit (HOSPITAL_COMMUNITY): Payer: Self-pay

## 2022-06-23 ENCOUNTER — Inpatient Hospital Stay
Payer: No Typology Code available for payment source | Attending: Hematology and Oncology | Admitting: Hematology and Oncology

## 2022-06-23 DIAGNOSIS — Z510 Encounter for antineoplastic radiation therapy: Secondary | ICD-10-CM | POA: Insufficient documentation

## 2022-06-23 DIAGNOSIS — C50411 Malignant neoplasm of upper-outer quadrant of right female breast: Secondary | ICD-10-CM | POA: Insufficient documentation

## 2022-06-23 DIAGNOSIS — Z170 Estrogen receptor positive status [ER+]: Secondary | ICD-10-CM | POA: Diagnosis not present

## 2022-06-23 LAB — RAD ONC ARIA SESSION SUMMARY
Course Elapsed Days: 39
Plan Fractions Treated to Date: 25
Plan Prescribed Dose Per Fraction: 1.8 Gy
Plan Total Fractions Prescribed: 28
Plan Total Prescribed Dose: 50.4 Gy
Reference Point Dosage Given to Date: 45 Gy
Reference Point Session Dosage Given: 1.8 Gy
Session Number: 25

## 2022-06-23 MED ORDER — TAMOXIFEN CITRATE 20 MG PO TABS
20.0000 mg | ORAL_TABLET | Freq: Every day | ORAL | 3 refills | Status: DC
  Filled 2022-06-23: qty 90, 90d supply, fill #0
  Filled 2022-11-10: qty 90, 90d supply, fill #1
  Filled 2023-02-07: qty 90, 90d supply, fill #2
  Filled 2023-05-08 – 2023-05-16 (×2): qty 90, 90d supply, fill #3

## 2022-06-23 NOTE — Assessment & Plan Note (Addendum)
02/10/2022:Palpable right breast mass UOQ spiculated mass at 10 o'clock position 1.8 cm, axilla negative, ultrasound-guided biopsy: Grade 2 IDC, ER 100%, PR 100%, HER2 equivocal by IHC, FISH negative, Ki-67 30%  04/04/2022:Right lumpectomy: Grade 2 IDC 3.7 cm, with DCIS intermediate grade, 0/4 lymph nodes negative, inferior margin was removed through mammoplasty and therefore all margins are clear.  ER 100%, PR 100%, Ki-67 equivocal by IHC negative by FISH, Ki-67 30%  Oncotype DX score 10: Distant recurrence: 3%  Adjuvant radiation therapy: 05/16/2022-07/03/2022  Tamoxifen counseling: We discussed the risks and benefits of tamoxifen. These include but not limited to insomnia, hot flashes, mood changes, vaginal dryness, and weight gain. Although rare, serious side effects including endometrial cancer, risk of blood clots were also discussed. We strongly believe that the benefits far outweigh the risks. Patient understands these risks and consented to starting treatment. Planned treatment duration is 10 years. She will start tamoxifen 07/31/2022. She was started half a tablet a day for a month and if she tolerates it well will increase to full tablet.  Return to clinic in 3 months for survivorship care plan visit

## 2022-06-24 ENCOUNTER — Other Ambulatory Visit: Payer: Self-pay

## 2022-06-24 ENCOUNTER — Ambulatory Visit
Admission: RE | Admit: 2022-06-24 | Discharge: 2022-06-24 | Disposition: A | Discharge status: Home / Self Care | Payer: No Typology Code available for payment source | Source: Ambulatory Visit | Attending: Radiation Oncology | Admitting: Radiation Oncology

## 2022-06-24 ENCOUNTER — Other Ambulatory Visit (HOSPITAL_COMMUNITY): Payer: Self-pay

## 2022-06-24 DIAGNOSIS — Z510 Encounter for antineoplastic radiation therapy: Secondary | ICD-10-CM | POA: Diagnosis not present

## 2022-06-24 LAB — RAD ONC ARIA SESSION SUMMARY
Course Elapsed Days: 40
Plan Fractions Treated to Date: 26
Plan Prescribed Dose Per Fraction: 1.8 Gy
Plan Total Fractions Prescribed: 28
Plan Total Prescribed Dose: 50.4 Gy
Reference Point Dosage Given to Date: 46.8 Gy
Reference Point Session Dosage Given: 1.8 Gy
Session Number: 26

## 2022-06-25 ENCOUNTER — Ambulatory Visit
Admission: RE | Admit: 2022-06-25 | Discharge: 2022-06-25 | Disposition: A | Discharge status: Home / Self Care | Payer: No Typology Code available for payment source | Source: Ambulatory Visit | Attending: Radiation Oncology | Admitting: Radiation Oncology

## 2022-06-25 ENCOUNTER — Other Ambulatory Visit: Payer: Self-pay

## 2022-06-25 ENCOUNTER — Ambulatory Visit: Payer: No Typology Code available for payment source

## 2022-06-25 DIAGNOSIS — Z510 Encounter for antineoplastic radiation therapy: Secondary | ICD-10-CM | POA: Diagnosis not present

## 2022-06-25 LAB — RAD ONC ARIA SESSION SUMMARY
Course Elapsed Days: 41
Plan Fractions Treated to Date: 27
Plan Prescribed Dose Per Fraction: 1.8 Gy
Plan Total Fractions Prescribed: 28
Plan Total Prescribed Dose: 50.4 Gy
Reference Point Dosage Given to Date: 48.6 Gy
Reference Point Session Dosage Given: 1.8 Gy
Session Number: 27

## 2022-06-26 ENCOUNTER — Ambulatory Visit: Payer: No Typology Code available for payment source

## 2022-06-26 ENCOUNTER — Ambulatory Visit
Admission: RE | Admit: 2022-06-26 | Discharge: 2022-06-26 | Disposition: A | Discharge status: Home / Self Care | Payer: No Typology Code available for payment source | Source: Ambulatory Visit | Attending: Radiation Oncology | Admitting: Radiation Oncology

## 2022-06-26 ENCOUNTER — Other Ambulatory Visit: Payer: Self-pay

## 2022-06-26 DIAGNOSIS — Z510 Encounter for antineoplastic radiation therapy: Secondary | ICD-10-CM | POA: Diagnosis not present

## 2022-06-26 LAB — RAD ONC ARIA SESSION SUMMARY
Course Elapsed Days: 42
Plan Fractions Treated to Date: 28
Plan Prescribed Dose Per Fraction: 1.8 Gy
Plan Total Fractions Prescribed: 28
Plan Total Prescribed Dose: 50.4 Gy
Reference Point Dosage Given to Date: 50.4 Gy
Reference Point Session Dosage Given: 1.8 Gy
Session Number: 28

## 2022-06-27 ENCOUNTER — Other Ambulatory Visit: Payer: Self-pay

## 2022-06-27 ENCOUNTER — Ambulatory Visit
Admission: RE | Admit: 2022-06-27 | Discharge: 2022-06-27 | Disposition: A | Discharge status: Home / Self Care | Payer: No Typology Code available for payment source | Source: Ambulatory Visit | Attending: Radiation Oncology | Admitting: Radiation Oncology

## 2022-06-27 ENCOUNTER — Ambulatory Visit: Payer: No Typology Code available for payment source

## 2022-06-27 DIAGNOSIS — Z510 Encounter for antineoplastic radiation therapy: Secondary | ICD-10-CM | POA: Diagnosis not present

## 2022-06-27 LAB — RAD ONC ARIA SESSION SUMMARY
Course Elapsed Days: 43
Plan Fractions Treated to Date: 1
Plan Prescribed Dose Per Fraction: 2 Gy
Plan Total Fractions Prescribed: 5
Plan Total Prescribed Dose: 10 Gy
Reference Point Dosage Given to Date: 2 Gy
Reference Point Session Dosage Given: 2 Gy
Session Number: 29

## 2022-06-30 ENCOUNTER — Other Ambulatory Visit: Payer: Self-pay

## 2022-06-30 ENCOUNTER — Ambulatory Visit
Admission: RE | Admit: 2022-06-30 | Discharge: 2022-06-30 | Disposition: A | Discharge status: Home / Self Care | Payer: No Typology Code available for payment source | Source: Ambulatory Visit | Attending: Radiation Oncology | Admitting: Radiation Oncology

## 2022-06-30 ENCOUNTER — Encounter: Payer: Self-pay | Admitting: *Deleted

## 2022-06-30 DIAGNOSIS — Z510 Encounter for antineoplastic radiation therapy: Secondary | ICD-10-CM | POA: Diagnosis not present

## 2022-06-30 DIAGNOSIS — C50411 Malignant neoplasm of upper-outer quadrant of right female breast: Secondary | ICD-10-CM

## 2022-06-30 LAB — RAD ONC ARIA SESSION SUMMARY
Course Elapsed Days: 46
Plan Fractions Treated to Date: 2
Plan Prescribed Dose Per Fraction: 2 Gy
Plan Total Fractions Prescribed: 5
Plan Total Prescribed Dose: 10 Gy
Reference Point Dosage Given to Date: 4 Gy
Reference Point Session Dosage Given: 2 Gy
Session Number: 30

## 2022-07-01 ENCOUNTER — Other Ambulatory Visit: Payer: Self-pay

## 2022-07-01 ENCOUNTER — Ambulatory Visit: Payer: No Typology Code available for payment source

## 2022-07-01 ENCOUNTER — Ambulatory Visit
Admission: RE | Admit: 2022-07-01 | Discharge: 2022-07-01 | Disposition: A | Discharge status: Home / Self Care | Payer: No Typology Code available for payment source | Source: Ambulatory Visit | Attending: Radiation Oncology | Admitting: Radiation Oncology

## 2022-07-01 DIAGNOSIS — Z510 Encounter for antineoplastic radiation therapy: Secondary | ICD-10-CM | POA: Diagnosis not present

## 2022-07-01 LAB — RAD ONC ARIA SESSION SUMMARY
Course Elapsed Days: 47
Plan Fractions Treated to Date: 3
Plan Prescribed Dose Per Fraction: 2 Gy
Plan Total Fractions Prescribed: 5
Plan Total Prescribed Dose: 10 Gy
Reference Point Dosage Given to Date: 6 Gy
Reference Point Session Dosage Given: 2 Gy
Session Number: 31

## 2022-07-02 ENCOUNTER — Ambulatory Visit: Payer: No Typology Code available for payment source

## 2022-07-02 ENCOUNTER — Ambulatory Visit
Admission: RE | Admit: 2022-07-02 | Discharge: 2022-07-02 | Disposition: A | Discharge status: Home / Self Care | Payer: No Typology Code available for payment source | Source: Ambulatory Visit | Attending: Radiation Oncology | Admitting: Radiation Oncology

## 2022-07-02 ENCOUNTER — Other Ambulatory Visit: Payer: Self-pay

## 2022-07-02 DIAGNOSIS — Z510 Encounter for antineoplastic radiation therapy: Secondary | ICD-10-CM | POA: Diagnosis not present

## 2022-07-02 LAB — RAD ONC ARIA SESSION SUMMARY
Course Elapsed Days: 48
Plan Fractions Treated to Date: 4
Plan Prescribed Dose Per Fraction: 2 Gy
Plan Total Fractions Prescribed: 5
Plan Total Prescribed Dose: 10 Gy
Reference Point Dosage Given to Date: 8 Gy
Reference Point Session Dosage Given: 2 Gy
Session Number: 32

## 2022-07-03 ENCOUNTER — Ambulatory Visit
Admission: RE | Admit: 2022-07-03 | Discharge: 2022-07-03 | Disposition: A | Discharge status: Home / Self Care | Payer: No Typology Code available for payment source | Source: Ambulatory Visit | Attending: Radiation Oncology | Admitting: Radiation Oncology

## 2022-07-03 ENCOUNTER — Encounter: Payer: Self-pay | Admitting: Radiation Oncology

## 2022-07-03 ENCOUNTER — Other Ambulatory Visit: Payer: Self-pay

## 2022-07-03 DIAGNOSIS — C50411 Malignant neoplasm of upper-outer quadrant of right female breast: Secondary | ICD-10-CM

## 2022-07-03 DIAGNOSIS — Z510 Encounter for antineoplastic radiation therapy: Secondary | ICD-10-CM | POA: Diagnosis not present

## 2022-07-03 LAB — RAD ONC ARIA SESSION SUMMARY
Course Elapsed Days: 49
Plan Fractions Treated to Date: 5
Plan Prescribed Dose Per Fraction: 2 Gy
Plan Total Fractions Prescribed: 5
Plan Total Prescribed Dose: 10 Gy
Reference Point Dosage Given to Date: 10 Gy
Reference Point Session Dosage Given: 2 Gy
Session Number: 33

## 2022-07-03 MED ORDER — RADIAPLEXRX EX GEL: Freq: Once | CUTANEOUS | Status: AC

## 2022-07-07 ENCOUNTER — Ambulatory Visit: Payer: No Typology Code available for payment source | Attending: Surgery

## 2022-07-07 VITALS — Wt 197.1 lb

## 2022-07-07 DIAGNOSIS — Z483 Aftercare following surgery for neoplasm: Secondary | ICD-10-CM | POA: Insufficient documentation

## 2022-07-07 NOTE — Therapy (Signed)
OUTPATIENT PHYSICAL THERAPY SOZO SCREENING NOTE   Patient Name: Grace Harvey MRN: [id number] DOB:05-Nov-1980, 42 y.o., female Today's Date: 07/07/2022  PCP: Mayra Neer, MD REFERRING PROVIDER: Coralie Keens, MD   PT End of Session - 07/07/22 1027     Visit Number 5   # unchanged due to screen only   PT Start Time 1024    PT Stop Time 1029    PT Time Calculation (min) 5 min    Activity Tolerance Patient tolerated treatment well    Behavior During Therapy [facility] for tasks assessed/performed             Past Medical History:  Diagnosis Date   Cervical intraepithelial neoplasm HIGH GRADE   Family history of breast cancer 02/19/2022   Family history of prostate cancer 02/19/2022   Past Surgical History:  Procedure Laterality Date   BREAST LUMPECTOMY WITH RADIOACTIVE SEED AND SENTINEL LYMPH NODE BIOPSY Right 04/04/2022   Procedure: RIGHT BREAST LUMPECTOMY WITH RADIOACTIVE SEED AND SENTINEL LYMPH NODE BIOPSY;  Surgeon: Coralie Keens, MD;  Location: [place];  Service: General;  Laterality: Right;   BREAST REDUCTION SURGERY Bilateral 04/04/2022   Procedure: RIGHT SIDE ONCOPLASTIC BREAST REDUCTION, LEFT SIDE BREAST REDUCTION FOR SYMMETRY;  Surgeon: Cindra Presume, MD;  Location: [place];  Service: Plastics;  Laterality: Bilateral;   INTRAUTERINE DEVICE (IUD) INSERTION     mirena iud insertion 12-23-19   IUD REMOVAL  03/02/2012   MIRENA   laser vaporization of cervix  10/14/2011   RESET FX RIGHT ARM  AGE 55   WISDOM TOOTH EXTRACTION  03/04/2012   ORAL SURGEON OFFICE   Patient Active Problem List   Diagnosis Date Noted   Genetic testing 03/03/2022   Family history of breast cancer 02/19/2022   Family history of prostate cancer 02/19/2022   Malignant neoplasm of upper-outer quadrant of right breast in female, estrogen receptor positive ([place]) 02/17/2022   SVD 6/15 03/27/2018   Postpartum care following vaginal delivery 03/27/2018    Second-degree perineal laceration, with delivery 03/27/2018    REFERRING DIAG: right breast cancer at risk for lymphedema  THERAPY DIAG: Aftercare following surgery for neoplasm  PERTINENT HISTORY: Patient was diagnosed on 02/10/2022 with right grade 2 invasive ductal carcinoma breast cancer. It measures 1.8 cm and is located in the upper outer quadrant. It is ER/PR positive and HER2 negative with a Ki67 of 30%.   PRECAUTIONS: right UE Lymphedema risk, None  SUBJECTIVE: Pt returns for her first 3 month L-Dex screen.   PAIN:  Are you having pain? No  SOZO SCREENING: Patient was assessed today using the SOZO machine to determine the lymphedema index score. This was compared to her baseline score. It was determined that she is within the recommended range when compared to her baseline and no further action is needed at this time. She will continue SOZO screenings. These are done every 3 months for 2 years post operatively followed by every 6 months for 2 years, and then annually.   L-DEX FLOWSHEETS - 07/07/22 1000       L-DEX LYMPHEDEMA SCREENING   Measurement Type Unilateral    L-DEX MEASUREMENT EXTREMITY Upper Extremity    POSITION  Standing    DOMINANT SIDE Right    At Risk Side Right    BASELINE SCORE (UNILATERAL) -4.5    L-DEX SCORE (UNILATERAL) -0.6    VALUE CHANGE (UNILAT) 3.9  Grace Harvey, PTA 07/07/2022, 10:29 AM

## 2022-07-08 NOTE — Progress Notes (Signed)
                                                                                                                                                             Patient Name: Grace Harvey MRN: [id number] DOB: 1981/02/19 Referring Physician: Mayra Neer (Profile Not Attached) Date of Service: 07/03/2022 [facility], [place]                                                        End Of Treatment Note  Diagnoses: C50.411-Malignant neoplasm of upper-outer quadrant of right female breast  Cancer Staging:  Stage IA, pT2N0M0, grade 2, ER/PR positive invasive ductal carcinoma of the right breast.  Intent: Curative  Radiation Treatment Dates: 05/15/2022 through 07/03/2022 Site Technique Total Dose (Gy) Dose per Fx (Gy) Completed Fx Beam Energies  Breast, Right: Breast_R 3D 50.4/50.4 1.8 28/28 6XFFF  Breast, Right: Breast_R_Bst 3D 10/10 2 5/5 6X   Narrative: The patient tolerated radiation therapy relatively well. She developed fatigue and anticipated skin changes in the treatment field. She was also treated with a third course of antibiotics for infection by myself during, but toward the end of her therapy.  Plan: The patient will receive a call in about one month from the radiation oncology department. She will continue follow up with Dr. Lindi Adie as well.   ________________________________________________    Carola Rhine, [facility]

## 2022-07-14 ENCOUNTER — Other Ambulatory Visit (HOSPITAL_COMMUNITY): Payer: Self-pay

## 2022-08-11 ENCOUNTER — Ambulatory Visit
Admission: RE | Admit: 2022-08-11 | Discharge: 2022-08-11 | Disposition: A | Discharge status: Home / Self Care | Payer: No Typology Code available for payment source | Source: Ambulatory Visit | Attending: Radiation Oncology | Admitting: Radiation Oncology

## 2022-08-11 NOTE — Progress Notes (Signed)
  Radiation Oncology         [phone] ________________________________  Name: Grace Harvey MRN: [id number]  Date of Service: 08/11/2022  DOB: Feb 27, 1981  Post Treatment Telephone Note  Diagnosis:  Stage IA, pT2N0M0, grade 2, ER/PR positive invasive ductal carcinoma of the right breast.  Intent: Curative  Radiation Treatment Dates: 05/15/2022 through 07/03/2022 Site Technique Total Dose (Gy) Dose per Fx (Gy) Completed Fx Beam Energies  Breast, Right: Breast_R 3D 50.4/50.4 1.8 28/28 6XFFF  Breast, Right: Breast_R_Bst 3D 10/10 2 5/5 6X  (as documented in provider EOT note)   The patient was not available for call today. A detailed voicemail was left for patient.  The patient was encouraged to avoid sun exposure in the area of prior treatment for up to one year following radiation with either sunscreen or by the style of clothing worn in the sun.  The patient has scheduled follow up with her medical oncologist Dr. Lindi Adie for ongoing surveillance, and was encouraged to call if she develops concerns or questions regarding radiation.   Leandra Kern, LPN

## 2022-08-12 ENCOUNTER — Other Ambulatory Visit (HOSPITAL_COMMUNITY): Payer: Self-pay

## 2022-09-08 ENCOUNTER — Other Ambulatory Visit (HOSPITAL_COMMUNITY): Payer: Self-pay

## 2022-09-10 ENCOUNTER — Other Ambulatory Visit (HOSPITAL_COMMUNITY): Payer: Self-pay

## 2022-09-10 MED ORDER — VENLAFAXINE HCL ER 75 MG PO CP24
75.0000 mg | ORAL_CAPSULE | Freq: Every day | ORAL | 2 refills | Status: DC
  Filled 2022-09-10: qty 30, 30d supply, fill #0
  Filled 2022-10-12: qty 30, 30d supply, fill #1
  Filled 2022-11-10: qty 30, 30d supply, fill #2

## 2022-09-17 ENCOUNTER — Other Ambulatory Visit (HOSPITAL_COMMUNITY): Payer: Self-pay

## 2022-09-18 ENCOUNTER — Other Ambulatory Visit (HOSPITAL_COMMUNITY): Payer: Self-pay

## 2022-09-22 ENCOUNTER — Inpatient Hospital Stay: Payer: No Typology Code available for payment source | Attending: Hematology and Oncology | Admitting: Adult Health

## 2022-09-22 VITALS — BP 126/70 | HR 84 | Temp 97.7°F | Resp 16 | Ht 64.0 in | Wt 195.6 lb

## 2022-09-22 DIAGNOSIS — Z923 Personal history of irradiation: Secondary | ICD-10-CM | POA: Insufficient documentation

## 2022-09-22 DIAGNOSIS — C50411 Malignant neoplasm of upper-outer quadrant of right female breast: Secondary | ICD-10-CM | POA: Diagnosis present

## 2022-09-22 DIAGNOSIS — Z170 Estrogen receptor positive status [ER+]: Secondary | ICD-10-CM | POA: Insufficient documentation

## 2022-09-22 DIAGNOSIS — Z79899 Other long term (current) drug therapy: Secondary | ICD-10-CM | POA: Diagnosis not present

## 2022-09-22 DIAGNOSIS — Z79810 Long term (current) use of selective estrogen receptor modulators (SERMs): Secondary | ICD-10-CM | POA: Insufficient documentation

## 2022-09-22 NOTE — Progress Notes (Signed)
SURVIVORSHIP VISIT:   BRIEF ONCOLOGIC HISTORY:  Oncology History  Malignant neoplasm of upper-outer quadrant of right breast in female, estrogen receptor positive ([place])  02/10/2022 Initial Diagnosis   Palpable right breast mass UOQ spiculated mass at 10 o'clock position 1.8 cm, axilla negative, ultrasound-guided biopsy: Grade 2 IDC, ER 100%, PR 100%, HER2 equivocal by IHC, FISH negative, Ki-67 30%   02/19/2022 Cancer Staging   Staging form: Breast, AJCC 8th Edition - Clinical: Stage IA (cT1c, cN0, cM0, G2, ER+, PR+, HER2-) - Signed by Nicholas Lose, MD on 02/19/2022 Stage prefix: Initial diagnosis Histologic grading system: 3 grade system   02/28/2022 Genetic Testing   Negative hereditary cancer genetic testing: no pathogenic variants detected in [employer] CustomNext.  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  Report dates are 02/28/2022 and Mar 04, 2022.   The CustomNext-Cancer+RNAinsight panel offered by [employer] Forts includes sequencing and rearrangement analysis for the following 47 genes:  APC, ATM, AXIN2, BARD1, BMPR1A, BRCA1, BRCA2, BRIP1, CDH1, CDK4, CDKN2A, CHEK2, DICER1, EPCAM, GREM1, HOXB13, MEN1, MLH1, MSH2, MSH3, MSH6, MUTYH, NBN, NF1, NF2, NTHL1, PALB2, PMS2, POLD1, POLE, PTEN, RAD51C, RAD51D, RECQL, RET, SDHA, SDHAF2, SDHB, SDHC, SDHD, SMAD4, SMARCA4, STK11, TP53, TSC1, TSC2, and VHL.  RNA data is routinely analyzed for use in variant interpretation for all genes.   04/04/2022 Surgery   Right lumpectomy: Grade 2 IDC 3.7 cm, with DCIS intermediate grade, 0/4 lymph nodes negative, inferior margin was removed through mammoplasty and therefore all margins are clear.  ER 100%, PR 100%, Ki-67 equivocal by IHC negative by FISH, Ki-67 30%   04/14/2022 Oncotype testing   Oncotype score 10, risk of recurrence: 3%   05/16/2022 - 07/03/2022 Radiation Therapy   Site Technique Total Dose (Gy) Dose per Fx (Gy) Completed Fx Beam Energies  Breast, Right: Breast_R 3D  50.4/50.4 1.8 28/28 6XFFF  Breast, Right: Breast_R_Bst 3D 10/10 2 5/5 6X     07/31/2022 -  Anti-estrogen oral therapy   Tamoxifen     INTERVAL HISTORY:  Grace Harvey to review her survivorship care plan detailing her treatment course for breast cancer, as well as monitoring long-term side effects of that treatment, education regarding health maintenance, screening, and overall wellness and health promotion.     Overall, Grace Harvey reports feeling quite well.  She is taking tamoxifen daily with good tolerance.  REVIEW OF SYSTEMS:  Review of Systems  Constitutional:  Negative for appetite change, chills, fatigue, fever and unexpected weight change.  HENT:   Negative for hearing loss, lump/mass and trouble swallowing.   Eyes:  Negative for eye problems and icterus.  Respiratory:  Negative for chest tightness, cough and shortness of breath.   Cardiovascular:  Negative for chest pain, leg swelling and palpitations.  Gastrointestinal:  Negative for abdominal distention, abdominal pain, constipation, diarrhea, nausea and vomiting.  Endocrine: Negative for hot flashes.  Genitourinary:  Negative for difficulty urinating.   Musculoskeletal:  Negative for arthralgias.  Skin:  Negative for itching and rash.  Neurological:  Negative for dizziness, extremity weakness, headaches and numbness.  Hematological:  Negative for adenopathy. Does not bruise/bleed easily.  Psychiatric/Behavioral:  Negative for depression. The patient is not nervous/anxious.    Breast: Denies any new nodularity, masses, tenderness, nipple changes, or nipple discharge.        PAST MEDICAL/SURGICAL HISTORY:  Past Medical History:  Diagnosis Date   Cervical intraepithelial neoplasm HIGH GRADE   Family history of breast cancer 02/19/2022   Family history of prostate  cancer 02/19/2022   Past Surgical History:  Procedure Laterality Date   BREAST LUMPECTOMY WITH RADIOACTIVE SEED AND SENTINEL LYMPH NODE BIOPSY Right  04/04/2022   Procedure: RIGHT BREAST LUMPECTOMY WITH RADIOACTIVE SEED AND SENTINEL LYMPH NODE BIOPSY;  Surgeon: Coralie Keens, MD;  Location: [place];  Service: General;  Laterality: Right;   BREAST REDUCTION SURGERY Bilateral 04/04/2022   Procedure: RIGHT SIDE ONCOPLASTIC BREAST REDUCTION, LEFT SIDE BREAST REDUCTION FOR SYMMETRY;  Surgeon: Cindra Presume, MD;  Location: [place];  Service: Plastics;  Laterality: Bilateral;   INTRAUTERINE DEVICE (IUD) INSERTION     mirena iud insertion 12-23-19   IUD REMOVAL  03/02/2012   MIRENA   laser vaporization of cervix  10/14/2011   RESET FX RIGHT ARM  AGE 5   WISDOM TOOTH EXTRACTION  03/04/2012   ORAL SURGEON OFFICE     ALLERGIES:  No Known Allergies   CURRENT MEDICATIONS:  Outpatient Encounter Medications as of 09/22/2022  Medication Sig   tamoxifen (NOLVADEX) 20 MG tablet Take 1 tablet (20 mg total) by mouth daily.   venlafaxine XR (EFFEXOR-XR) 75 MG 24 hr capsule Take 1 capsule (75 mg total) by mouth daily with food.   No facility-administered encounter medications on file as of 09/22/2022.     ONCOLOGIC FAMILY HISTORY:  Family History  Problem Relation Age of Onset   Hypertension Father    Prostate cancer Father 18   Breast cancer Maternal Aunt 35   Breast cancer Other        MGM's sister; dx 51s    SOCIAL HISTORY:  Social History   Socioeconomic History   Marital status: Married    Spouse name: Not on file   Number of children: Not on file   Years of education: Not on file   Highest education level: Not on file  Occupational History   Not on file  Tobacco Use   Smoking status: Never   Smokeless tobacco: Never  Vaping Use   Vaping Use: Never used  Substance and Sexual Activity   Alcohol use: No    Alcohol/week: 0.0 standard drinks of alcohol   Drug use: No   Sexual activity: Yes    Partners: Male    Birth control/protection: I.U.D.  Other Topics Concern   Not on file   Social History Narrative   Not on file   Social Determinants of Health   Financial Resource Strain: Not on file  Food Insecurity: Not on file  Transportation Needs: Not on file  Physical Activity: Not on file  Stress: Not on file  Social Connections: Not on file  Intimate Partner Violence: Not on file     OBSERVATIONS/OBJECTIVE:  BP 126/70 (BP Location: Left Arm, Patient Position: Sitting)   Pulse 84   Temp 97.7 F (36.5 C) (Temporal)   Resp 16   Ht _0  (1.626 m)   Wt 195 lb 9.6 oz (88.7 kg)   SpO2 99%   BMI 33.57 kg/m  GENERAL: Patient is a well appearing female in no acute distress HEENT:  Sclerae anicteric.  Oropharynx clear and moist. No ulcerations or evidence of oropharyngeal candidiasis. Neck is supple.  NODES:  No cervical, supraclavicular, or axillary lymphadenopathy palpated.  BREAST EXAM:  right breast s/p lumpectomy and radiation, no sign of local recurrence, left breast benign LUNGS:  Clear to auscultation bilaterally.  No wheezes or rhonchi. HEART:  Regular rate and rhythm. No murmur appreciated. ABDOMEN:  Soft, nontender.  Positive, normoactive bowel  sounds. No organomegaly palpated. MSK:  No focal spinal tenderness to palpation. Full range of motion bilaterally in the upper extremities. EXTREMITIES:  No peripheral edema.   SKIN:  Clear with no obvious rashes or skin changes. No nail dyscrasia. NEURO:  Nonfocal. Well oriented.  Appropriate affect.   LABORATORY DATA:  None for this visit.  DIAGNOSTIC IMAGING:  None for this visit.      ASSESSMENT AND PLAN:  Grace Harvey is a pleasant 41 y.o. female with Stage 1A right breast invasive ductal carcinoma, ER+/PR+/HER2-, diagnosed in May 2023, treated with lumpectomy, adjuvant radiation therapy, and anti-estrogen therapy with tamoxifen beginning in October 2023.  She presents to the Survivorship Clinic for our initial meeting and routine follow-up post-completion of treatment for breast cancer.    1.  Stage 1A right breast cancer:  Grace Harvey is continuing to recover from definitive treatment for breast cancer. She will follow-up with her medical oncologist, Dr. Lindi Adie in 6 months with history and physical exam per surveillance protocol.  She will continue her anti-estrogen therapy with Tamoxifen. Thus far, she is tolerating the Tamoixfen well, with minimal side effects. She was instructed to make Dr. Lindi Adie or myself aware if she begins to experience any worsening side effects of the medication and I could see her back in clinic to help manage those side effects, as needed. Her mammogram is due 01/2023; orders placed today. Today, a comprehensive survivorship care plan and treatment summary was reviewed with the patient today detailing her breast cancer diagnosis, treatment course, potential late/long-term effects of treatment, appropriate follow-up care with recommendations for the future, and patient education resources.  A copy of this summary, along with a letter will be sent to the patient's primary care provider via mail/fax/In Basket message after today's visit.    2. Bone health:   She was given education on specific activities to promote bone health.  3. Cancer screening:  Due to Grace Harvey's history and her age, she should receive screening for skin cancers, colon cancer (at age 73), and gynecologic cancers.  The information and recommendations are listed on the patient's comprehensive care plan/treatment summary and were reviewed in detail with the patient.    4. Health maintenance and wellness promotion: Grace Harvey was encouraged to consume 5-7 servings of fruits and vegetables per day. We reviewed the "Nutrition Rainbow" handout.  She was also encouraged to engage in moderate to vigorous exercise for 30 minutes per day most days of the week. We discussed the [employer] fitness program, which is designed for cancer survivors to help them become more physically fit after cancer treatments.  She  was instructed to limit her alcohol consumption and continue to abstain from tobacco use.     5. Support services/counseling: It is not uncommon for this period of the patient's cancer care trajectory to be one of many emotions and stressors. She was given information regarding our available services and encouraged to contact me with any questions or for help enrolling in any of our support group/programs.    Follow up instructions:    -Return to cancer center in 6 months for f/u with Dr. Lindi Adie  -Mammogram due in 01/2023 -She is welcome to return back to the Survivorship Clinic at any time; no additional follow-up needed at this time.  -Consider referral back to survivorship as a long-term survivor for continued surveillance  The patient was provided an opportunity to ask questions and all were answered. The patient agreed with the plan and  demonstrated an understanding of the instructions.   Total encounter time:45 minutes*in face-to-face visit time, chart review, lab review, care coordination, order entry, and documentation of the encounter time.    Wilber Bihari, NP 09/22/22 10:34 AM Medical Oncology and Hematology [facility] [place], [place] [address] Tel. [phone]    Fax. [phone]  *Total Encounter Time as defined by the Centers for Medicare and Medicaid Services includes, in addition to the face-to-face time of a patient visit (documented in the note above) non-face-to-face time: obtaining and reviewing outside history, ordering and reviewing medications, tests or procedures, care coordination (communications with other health care professionals or caregivers) and documentation in the medical record.

## 2022-09-29 ENCOUNTER — Ambulatory Visit: Payer: 59 | Attending: Surgery

## 2022-09-29 VITALS — Wt 193.2 lb

## 2022-09-29 DIAGNOSIS — Z483 Aftercare following surgery for neoplasm: Secondary | ICD-10-CM | POA: Insufficient documentation

## 2022-09-29 NOTE — Patient Instructions (Signed)
PLEASE KEEP YOUR COMPRESSION GARMENT ON DURING THE DAY TO GET THE BEST SWELLING REDUCTION. HERE ARE SOME ADDITIONAL TIPS: Do not sleep in your garment. If you have pain or notice swelling in your hand or at the top of your shoulder, call your therapist. This may be a sign that you need a different garment. 3.  Take good care of your garment so it lasts longer: Follow washing instructions on your garment label or box. Wash periodically using a mild detergent in warm water.  Do not use fabric softener or bleach.  Place garment in a mesh lingerie bag and use the gentle cycle of the washing machine or hand wash. Tumble dry low or lay flat to dry. TAKE CARE OF YOUR SKIN Apply a low pH moisturizing lotion to your skin daily Avoid scratching your skin Treat skin irritations quickly  Know the 5 warning signs of infection: redness, pain, warmth to touch, fever and increased swelling.  Call your physician immediately if you notice any of these signs of a possible infection.    Cancer Rehab [phone]

## 2022-09-29 NOTE — Therapy (Signed)
  OUTPATIENT PHYSICAL THERAPY SOZO SCREENING NOTE   Patient Name: Grace Harvey MRN: [id number] DOB:[birth year], 41 y.o., female Today's Date: 09/29/2022  PCP: Mayra Neer, MD REFERRING PROVIDER: Coralie Keens, MD   PT End of Session - 09/29/22 1518     Visit Number 5   # unchanged due to screen only   PT Start Time 1516    PT Stop Time 1527    PT Time Calculation (min) 11 min    Activity Tolerance Patient tolerated treatment well    Behavior During Therapy [facility] for tasks assessed/performed             Past Medical History:  Diagnosis Date   Cervical intraepithelial neoplasm HIGH GRADE   Family history of breast cancer 02/19/2022   Family history of prostate cancer 02/19/2022   Past Surgical History:  Procedure Laterality Date   BREAST LUMPECTOMY WITH RADIOACTIVE SEED AND SENTINEL LYMPH NODE BIOPSY Right 04/04/2022   Procedure: RIGHT BREAST LUMPECTOMY WITH RADIOACTIVE SEED AND SENTINEL LYMPH NODE BIOPSY;  Surgeon: Coralie Keens, MD;  Location: [place];  Service: General;  Laterality: Right;   BREAST REDUCTION SURGERY Bilateral 04/04/2022   Procedure: RIGHT SIDE ONCOPLASTIC BREAST REDUCTION, LEFT SIDE BREAST REDUCTION FOR SYMMETRY;  Surgeon: Cindra Presume, MD;  Location: [place];  Service: Plastics;  Laterality: Bilateral;   INTRAUTERINE DEVICE (IUD) INSERTION     mirena iud insertion 12-23-19   IUD REMOVAL  03/02/2012   MIRENA   laser vaporization of cervix  10/14/2011   RESET FX RIGHT ARM  AGE 41   WISDOM TOOTH EXTRACTION  03/04/2012   ORAL SURGEON OFFICE   Patient Active Problem List   Diagnosis Date Noted   Genetic testing 03/03/2022   Family history of breast cancer 02/19/2022   Family history of prostate cancer 02/19/2022   Anxiety 02/19/2022   Malignant neoplasm of upper-outer quadrant of right breast in female, estrogen receptor positive ([place]) 02/17/2022   SVD 6/15 03/27/2018   Postpartum care following vaginal  delivery 03/27/2018   Second-degree perineal laceration, with delivery 03/27/2018    REFERRING DIAG: right breast cancer at risk for lymphedema  THERAPY DIAG:  Aftercare following surgery for neoplasm  PERTINENT HISTORY:  right UE Lymphedema risk, None   PRECAUTIONS: right UE Lymphedema risk, None  SUBJECTIVE: Pt returns for her 3 month L-Dex screen.   PAIN:  Are you having pain? No  SOZO SCREENING:  Patient was assessed today using the SOZO machine to determine the lymphedema index score. This was compared to her baseline score. It was determined that she is NOT within the recommended range when compared to her baseline and so she was fitted for a compression garment while in the clinic today. It is recommended she return in 1 month to be reassessed. If she continues to measure outside the recommended range, physical therapy treatment will be recommended at that time and a referral requested.   L-DEX FLOWSHEETS - 09/29/22 1500       L-DEX LYMPHEDEMA SCREENING   Measurement Type Unilateral    L-DEX MEASUREMENT EXTREMITY Upper Extremity    POSITION  Standing    DOMINANT SIDE Right    At Risk Side Right    BASELINE SCORE (UNILATERAL) -4.5    L-DEX SCORE (UNILATERAL) 5.9    VALUE CHANGE (UNILAT) 10.4              Otelia Limes, PTA 09/29/2022, 3:35 PM

## 2022-10-15 DIAGNOSIS — R197 Diarrhea, unspecified: Secondary | ICD-10-CM | POA: Diagnosis not present

## 2022-10-15 DIAGNOSIS — I890 Lymphedema, not elsewhere classified: Secondary | ICD-10-CM | POA: Diagnosis not present

## 2022-10-15 DIAGNOSIS — C50919 Malignant neoplasm of unspecified site of unspecified female breast: Secondary | ICD-10-CM | POA: Diagnosis not present

## 2022-10-15 DIAGNOSIS — F411 Generalized anxiety disorder: Secondary | ICD-10-CM | POA: Diagnosis not present

## 2022-10-30 ENCOUNTER — Ambulatory Visit: Payer: 59 | Attending: Surgery | Admitting: Rehabilitation

## 2022-10-30 ENCOUNTER — Encounter: Payer: Self-pay | Admitting: Rehabilitation

## 2022-10-30 DIAGNOSIS — Z483 Aftercare following surgery for neoplasm: Secondary | ICD-10-CM | POA: Insufficient documentation

## 2022-10-30 DIAGNOSIS — C50411 Malignant neoplasm of upper-outer quadrant of right female breast: Secondary | ICD-10-CM | POA: Insufficient documentation

## 2022-10-30 DIAGNOSIS — R293 Abnormal posture: Secondary | ICD-10-CM | POA: Insufficient documentation

## 2022-10-30 DIAGNOSIS — Z170 Estrogen receptor positive status [ER+]: Secondary | ICD-10-CM | POA: Insufficient documentation

## 2022-10-30 NOTE — Therapy (Signed)
  OUTPATIENT PHYSICAL THERAPY SOZO SCREENING NOTE   Patient Name: Grace Harvey MRN: [id number] DOB:Nov 15, 1980, 43 y.o., female Today's Date: 10/30/2022  PCP: Mayra Neer, MD REFERRING PROVIDER: Coralie Keens, MD   PT End of Session - 10/30/22 1555     Visit Number 5   screen   PT Start Time 1450    PT Stop Time 1458    PT Time Calculation (min) 8 min    Activity Tolerance Patient tolerated treatment well    Behavior During Therapy [facility] for tasks assessed/performed             Past Medical History:  Diagnosis Date   Cervical intraepithelial neoplasm HIGH GRADE   Family history of breast cancer 02/19/2022   Family history of prostate cancer 02/19/2022   Past Surgical History:  Procedure Laterality Date   BREAST LUMPECTOMY WITH RADIOACTIVE SEED AND SENTINEL LYMPH NODE BIOPSY Right 04/04/2022   Procedure: RIGHT BREAST LUMPECTOMY WITH RADIOACTIVE SEED AND SENTINEL LYMPH NODE BIOPSY;  Surgeon: Coralie Keens, MD;  Location: [place];  Service: General;  Laterality: Right;   BREAST REDUCTION SURGERY Bilateral 04/04/2022   Procedure: RIGHT SIDE ONCOPLASTIC BREAST REDUCTION, LEFT SIDE BREAST REDUCTION FOR SYMMETRY;  Surgeon: Cindra Presume, MD;  Location: [place];  Service: Plastics;  Laterality: Bilateral;   INTRAUTERINE DEVICE (IUD) INSERTION     mirena iud insertion 12-23-19   IUD REMOVAL  03/02/2012   MIRENA   laser vaporization of cervix  10/14/2011   RESET FX RIGHT ARM  AGE 81   WISDOM TOOTH EXTRACTION  03/04/2012   ORAL SURGEON OFFICE   Patient Active Problem List   Diagnosis Date Noted   Genetic testing 03/03/2022   Family history of breast cancer 02/19/2022   Family history of prostate cancer 02/19/2022   Anxiety 02/19/2022   Malignant neoplasm of upper-outer quadrant of right breast in female, estrogen receptor positive ([place]) 02/17/2022   SVD 6/15 03/27/2018   Postpartum care following vaginal delivery 03/27/2018    Second-degree perineal laceration, with delivery 03/27/2018    REFERRING DIAG: right breast cancer at risk for lymphedema  THERAPY DIAG:  Aftercare following surgery for neoplasm  Abnormal posture  Malignant neoplasm of upper-outer quadrant of right breast in female, estrogen receptor positive ([place])  PERTINENT HISTORY:  right UE Lymphedema risk, None   PRECAUTIONS: right UE Lymphedema risk, None  SUBJECTIVE: Pt returns for her 3 month L-Dex screen.   PAIN:  Are you having pain? No  SOZO SCREENING:  Patient was assessed today using the SOZO machine to determine the lymphedema index score. This was compared to her baseline score. It was determined that she is again in the green range and will start to wear her sleeve on an as needed basis.     L-DEX FLOWSHEETS - 10/30/22 1500       L-DEX LYMPHEDEMA SCREENING   Measurement Type Unilateral    L-DEX MEASUREMENT EXTREMITY Upper Extremity    POSITION  Standing    DOMINANT SIDE Right    At Risk Side Right    BASELINE SCORE (UNILATERAL) -4.5    L-DEX SCORE (UNILATERAL) -0.1    VALUE CHANGE (UNILAT) 4.4              Samyia Motter, Adrian Prince, PT 10/30/2022, 3:55 PM

## 2022-11-14 ENCOUNTER — Other Ambulatory Visit (HOSPITAL_COMMUNITY): Payer: Self-pay

## 2022-12-01 ENCOUNTER — Other Ambulatory Visit (HOSPITAL_COMMUNITY): Payer: Self-pay

## 2022-12-01 MED ORDER — PEG 3350-KCL-NA BICARB-NACL 420 G PO SOLR
ORAL | 0 refills | Status: DC
  Filled 2022-12-01: qty 4000, 1d supply, fill #0

## 2022-12-04 DIAGNOSIS — R197 Diarrhea, unspecified: Secondary | ICD-10-CM | POA: Diagnosis not present

## 2022-12-04 DIAGNOSIS — K635 Polyp of colon: Secondary | ICD-10-CM | POA: Diagnosis not present

## 2022-12-04 DIAGNOSIS — K648 Other hemorrhoids: Secondary | ICD-10-CM | POA: Diagnosis not present

## 2022-12-10 ENCOUNTER — Other Ambulatory Visit (HOSPITAL_COMMUNITY): Payer: Self-pay

## 2022-12-10 MED ORDER — VENLAFAXINE HCL ER 75 MG PO CP24
75.0000 mg | ORAL_CAPSULE | Freq: Every day | ORAL | 2 refills | Status: DC
  Filled 2022-12-10: qty 30, 30d supply, fill #0
  Filled 2023-01-11: qty 30, 30d supply, fill #1
  Filled 2023-02-09: qty 30, 30d supply, fill #2

## 2023-01-12 ENCOUNTER — Other Ambulatory Visit (HOSPITAL_COMMUNITY): Payer: Self-pay

## 2023-02-02 ENCOUNTER — Ambulatory Visit: Payer: 59 | Attending: Surgery

## 2023-02-02 VITALS — Wt 192.1 lb

## 2023-02-02 DIAGNOSIS — Z483 Aftercare following surgery for neoplasm: Secondary | ICD-10-CM

## 2023-02-02 NOTE — Therapy (Signed)
  OUTPATIENT PHYSICAL THERAPY SOZO SCREENING NOTE   Patient Name: Grace Harvey MRN: [id number] DOB:1981-04-15, 42 y.o., female Today's Date: 02/02/2023  PCP: Lupita Raider, MD REFERRING PROVIDER: Abigail Miyamoto, MD   PT End of Session - 02/02/23 [phone]     Visit Number 5   # unchanged due to screen only   PT Start Time 0929    PT Stop Time 0934    PT Time Calculation (min) 5 min    Activity Tolerance Patient tolerated treatment well    Behavior During Therapy [facility] for tasks assessed/performed             Past Medical History:  Diagnosis Date   Cervical intraepithelial neoplasm HIGH GRADE   Family history of breast cancer 02/19/2022   Family history of prostate cancer 02/19/2022   Past Surgical History:  Procedure Laterality Date   BREAST LUMPECTOMY WITH RADIOACTIVE SEED AND SENTINEL LYMPH NODE BIOPSY Right 04/04/2022   Procedure: RIGHT BREAST LUMPECTOMY WITH RADIOACTIVE SEED AND SENTINEL LYMPH NODE BIOPSY;  Surgeon: Abigail Miyamoto, MD;  Location: [facility];  Service: General;  Laterality: Right;   BREAST REDUCTION SURGERY Bilateral 04/04/2022   Procedure: RIGHT SIDE ONCOPLASTIC BREAST REDUCTION, LEFT SIDE BREAST REDUCTION FOR SYMMETRY;  Surgeon: Allena Napoleon, MD;  Location: [facility];  Service: Plastics;  Laterality: Bilateral;   INTRAUTERINE DEVICE (IUD) INSERTION     mirena iud insertion 12-23-19   IUD REMOVAL  03/02/2012   MIRENA   laser vaporization of cervix  10/14/2011   RESET FX RIGHT ARM  AGE 29   WISDOM TOOTH EXTRACTION  03/04/2012   ORAL SURGEON OFFICE   Patient Active Problem List   Diagnosis Date Noted   Genetic testing 03/03/2022   Family history of breast cancer 02/19/2022   Family history of prostate cancer 02/19/2022   Anxiety 02/19/2022   Malignant neoplasm of upper-outer quadrant of right breast in female, estrogen receptor positive 02/17/2022   SVD 6/15 03/27/2018   Postpartum care following vaginal delivery  03/27/2018   Second-degree perineal laceration, with delivery 03/27/2018    REFERRING DIAG: right breast cancer at risk for lymphedema  THERAPY DIAG:  Aftercare following surgery for neoplasm  PERTINENT HISTORY:  right UE Lymphedema risk, None   PRECAUTIONS: right UE Lymphedema risk, None  SUBJECTIVE: Pt returns for her 3 month L-Dex screen.   PAIN:  Are you having pain? No  SOZO SCREENING:  Patient was assessed today using the SOZO machine to determine the lymphedema index score. This was compared to her baseline score. It was determined that she is again in the green range and will start to wear her sleeve on an as needed basis.     L-DEX FLOWSHEETS - 02/02/23 0900       L-DEX LYMPHEDEMA SCREENING   Measurement Type Unilateral    L-DEX MEASUREMENT EXTREMITY Upper Extremity    POSITION  Standing    DOMINANT SIDE Right    At Risk Side Right    BASELINE SCORE (UNILATERAL) -4.5    L-DEX SCORE (UNILATERAL) -0.2    VALUE CHANGE (UNILAT) 4.3              Hermenia Bers, PTA 02/02/2023, 9:37 AM

## 2023-02-07 ENCOUNTER — Other Ambulatory Visit (HOSPITAL_COMMUNITY): Payer: Self-pay

## 2023-02-09 ENCOUNTER — Ambulatory Visit
Admission: RE | Admit: 2023-02-09 | Discharge: 2023-02-09 | Disposition: A | Discharge status: Home / Self Care | Payer: 59 | Source: Ambulatory Visit | Attending: Adult Health | Admitting: Adult Health

## 2023-02-09 DIAGNOSIS — Z170 Estrogen receptor positive status [ER+]: Secondary | ICD-10-CM

## 2023-02-09 DIAGNOSIS — Z853 Personal history of malignant neoplasm of breast: Secondary | ICD-10-CM | POA: Diagnosis not present

## 2023-02-09 DIAGNOSIS — Z803 Family history of malignant neoplasm of breast: Secondary | ICD-10-CM | POA: Diagnosis not present

## 2023-02-09 HISTORY — DX: Personal history of irradiation: Z92.3

## 2023-02-09 HISTORY — DX: Malignant neoplasm of unspecified site of unspecified female breast: C50.919

## 2023-03-04 ENCOUNTER — Ambulatory Visit (INDEPENDENT_AMBULATORY_CARE_PROVIDER_SITE_OTHER): Payer: 59 | Admitting: Obstetrics & Gynecology

## 2023-03-04 ENCOUNTER — Encounter: Payer: Self-pay | Admitting: Obstetrics & Gynecology

## 2023-03-04 ENCOUNTER — Other Ambulatory Visit (HOSPITAL_COMMUNITY)
Admission: RE | Admit: 2023-03-04 | Discharge: 2023-03-04 | Disposition: A | Discharge status: Home / Self Care | Payer: 59 | Source: Ambulatory Visit | Attending: Obstetrics & Gynecology | Admitting: Obstetrics & Gynecology

## 2023-03-04 VITALS — BP 110/76 | HR 84 | Ht 64.75 in | Wt 191.0 lb

## 2023-03-04 DIAGNOSIS — Z01419 Encounter for gynecological examination (general) (routine) without abnormal findings: Secondary | ICD-10-CM | POA: Insufficient documentation

## 2023-03-04 DIAGNOSIS — C50411 Malignant neoplasm of upper-outer quadrant of right female breast: Secondary | ICD-10-CM

## 2023-03-04 DIAGNOSIS — Z170 Estrogen receptor positive status [ER+]: Secondary | ICD-10-CM

## 2023-03-04 DIAGNOSIS — Z30431 Encounter for routine checking of intrauterine contraceptive device: Secondary | ICD-10-CM

## 2023-03-04 NOTE — Progress Notes (Signed)
Grace Harvey 02/10/1981 [id number]   History:    42 y.o. G3P2A1L2 Married.  Grace Harvey 41 yo, and Grace Harvey almost 42 yo.       RP:  Established patient presenting for annual gyn exam    HPI:  Well on Mirena IUD x 12/23/2019.  No BTB.  No pelvic pain. Normal vaginal secretions.  No pain with IC.   Pap test 01/13/22 Neg. Pap reflex today. Mictions/BMs wnl.  Colono precancer polyps removed in 2024. Rt invasive ductal Cancer of the Breast 04/04/22. Lumpectomy/bilateral breast reduction.  Radiation Therapy.  On Tamoxifen currently.  Genetic testing Negative.  BSO not recommended by Onco. Mammo 02/09/23 Benign.  Good nutrition, physically active.  BMI 32.03.    Past medical history,surgical history, family history and social history were all reviewed and documented in the EPIC chart.  Gynecologic History No LMP recorded. (Menstrual status: IUD).  Obstetric History OB History  Gravida Para Term Preterm AB Living  2 2 1    0 2  SAB IAB Ectopic Multiple Live Births        0 2    # Outcome Date GA Lbr Len/2nd Weight Sex Delivery Anes PTL Lv  2 Term 03/27/18 [redacted]w[redacted]d 01:45 / 00:09 8 lb 4.3 oz (3.751 kg) F Vag-Spont EPI  LIV  1 Para     F Vag-Spont        ROS: A ROS was performed and pertinent positives and negatives are included in the history. GENERAL: No fevers or chills. HEENT: No change in vision, no earache, sore throat or sinus congestion. NECK: No pain or stiffness. CARDIOVASCULAR: No chest pain or pressure. No palpitations. PULMONARY: No shortness of breath, cough or wheeze. GASTROINTESTINAL: No abdominal pain, nausea, vomiting or diarrhea, melena or bright red blood per rectum. GENITOURINARY: No urinary frequency, urgency, hesitancy or dysuria. MUSCULOSKELETAL: No joint or muscle pain, no back pain, no recent trauma. DERMATOLOGIC: No rash, no itching, no lesions. ENDOCRINE: No polyuria, polydipsia, no heat or cold intolerance. No recent change in weight. HEMATOLOGICAL: No anemia or easy bruising or  bleeding. NEUROLOGIC: No headache, seizures, numbness, tingling or weakness. PSYCHIATRIC: No depression, no loss of interest in normal activity or change in sleep pattern.     Exam:   BP 110/76   Pulse 84   Ht 5' 4.75" (1.645 m)   Wt 191 lb (86.6 kg)   SpO2 98%   BMI 32.03 kg/m   Body mass index is 32.03 kg/m.  General appearance : Well developed well nourished female. No acute distress HEENT: Eyes: no retinal hemorrhage or exudates,  Neck supple, trachea midline, no carotid bruits, no thyroidmegaly Lungs: Clear to auscultation, no rhonchi or wheezes, or rib retractions  Heart: Regular rate and rhythm, no murmurs or gallops Breast:Examined in sitting and supine position were symmetrical in appearance, no palpable masses or tenderness,  no skin retraction, no nipple inversion, no nipple discharge, no skin discoloration, no axillary or supraclavicular lymphadenopathy Abdomen: no palpable masses or tenderness, no rebound or guarding Extremities: no edema or skin discoloration or tenderness  Pelvic: Vulva: Normal             Vagina: No gross lesions or discharge  Cervix: No gross lesions or discharge.  IUD strings visible at [facility].  Pap reflex done.  Uterus  AV, normal size, shape and consistency, non-tender and mobile  Adnexa  Without masses or tenderness  Anus: Normal   Assessment/Plan:  42 y.o. female for annual exam   1. Encounter  for routine gynecological examination with Papanicolaou smear of cervix Well on Mirena IUD x 12/23/2019.  No BTB.  No pelvic pain. Normal vaginal secretions.  No pain with IC.   Pap test 01/13/22 Neg. Pap reflex today. Mictions/BMs wnl.  Colono precancer polyps removed in 2024. Rt invasive ductal Cancer of the Breast 04/04/22. Lumpectomy/bilateral breast reduction.  Radiation Therapy.  On Tamoxifen currently.  Genetic testing Negative.  BSO not recommended by Onco. Mammo 02/09/23 Benign.  Good nutrition, physically active.  BMI 32.03.   - Cytology - PAP( [facility])  2. Encounter for routine checking of intrauterine contraceptive device (IUD) Well on Mirena IUD x 12/23/2019.  No BTB.  No pelvic pain. Normal vaginal secretions.  No pain with IC. IUD in good position.  Progesterone is protective for the endometrium with patient on Tamoxifen.  If any abnormal vaginal bleeding, recommend a Pelvic US.  3. Malignant neoplasm of upper-outer quadrant of right breast in female, estrogen receptor positive (HCC)  Stage IA, pT2N0M0, grade 2, ER/PR positive invasive ductal carcinoma of the right breast.  Rt Lumpectomy/Radiation Therapy/Tamoxifen. Genetic screening Negative. BSO not recommended by Oncologist.  Genia Del MD, 2:39 PM

## 2023-03-06 LAB — CYTOLOGY - PAP: Diagnosis: NEGATIVE

## 2023-03-11 ENCOUNTER — Other Ambulatory Visit (HOSPITAL_COMMUNITY): Payer: Self-pay

## 2023-03-11 MED ORDER — VENLAFAXINE HCL ER 75 MG PO CP24
75.0000 mg | ORAL_CAPSULE | Freq: Every day | ORAL | 2 refills | Status: DC
  Filled 2023-03-11: qty 30, 30d supply, fill #0
  Filled 2023-04-13: qty 30, 30d supply, fill #1
  Filled 2023-06-09: qty 30, 30d supply, fill #2

## 2023-03-19 ENCOUNTER — Other Ambulatory Visit (HOSPITAL_COMMUNITY): Payer: Self-pay

## 2023-03-24 ENCOUNTER — Ambulatory Visit: Payer: Self-pay | Admitting: Hematology and Oncology

## 2023-03-24 ENCOUNTER — Encounter: Payer: Self-pay | Admitting: Adult Health

## 2023-03-24 ENCOUNTER — Other Ambulatory Visit: Payer: Self-pay

## 2023-03-24 ENCOUNTER — Inpatient Hospital Stay: Payer: 59 | Attending: Hematology and Oncology | Admitting: Adult Health

## 2023-03-24 VITALS — BP 123/94 | HR 84 | Temp 98.3°F | Resp 18 | Wt 188.4 lb

## 2023-03-24 DIAGNOSIS — Z79810 Long term (current) use of selective estrogen receptor modulators (SERMs): Secondary | ICD-10-CM | POA: Diagnosis not present

## 2023-03-24 DIAGNOSIS — C50411 Malignant neoplasm of upper-outer quadrant of right female breast: Secondary | ICD-10-CM | POA: Insufficient documentation

## 2023-03-24 DIAGNOSIS — Z923 Personal history of irradiation: Secondary | ICD-10-CM | POA: Insufficient documentation

## 2023-03-24 DIAGNOSIS — Z170 Estrogen receptor positive status [ER+]: Secondary | ICD-10-CM | POA: Insufficient documentation

## 2023-03-24 NOTE — Progress Notes (Unsigned)
[facility] Cancer Follow up:    Grace Raider, MD [address] [place] [address]   DIAGNOSIS: Cancer Staging  Malignant neoplasm of upper-outer quadrant of right breast in female, estrogen receptor positive (HCC) Staging form: Breast, AJCC 8th Edition - Clinical: Stage IA (cT1c, cN0, cM0, G2, ER+, PR+, HER2-) - Signed by Serena Croissant, MD on 02/19/2022 Stage prefix: Initial diagnosis Histologic grading system: 3 grade system - Pathologic stage from 04/28/2022: Stage IA (pT2, pN0(sn), cM0, G2, ER+, PR+, HER2-, Oncotype DX score: 10) - Unsigned Method of lymph node assessment: Sentinel lymph node biopsy Multigene prognostic tests performed: Oncotype DX Recurrence score range: Less than 11 Histologic grading system: 3 grade system   SUMMARY OF ONCOLOGIC HISTORY: Oncology History  Malignant neoplasm of upper-outer quadrant of right breast in female, estrogen receptor positive (HCC)  02/10/2022 Initial Diagnosis   Palpable right breast mass UOQ spiculated mass at 10 o'clock position 1.8 cm, axilla negative, ultrasound-guided biopsy: Grade 2 IDC, ER 100%, PR 100%, HER2 equivocal by IHC, FISH negative, Ki-67 30%   02/19/2022 Cancer Staging   Staging form: Breast, AJCC 8th Edition - Clinical: Stage IA (cT1c, cN0, cM0, G2, ER+, PR+, HER2-) - Signed by Serena Croissant, MD on 02/19/2022 Stage prefix: Initial diagnosis Histologic grading system: 3 grade system   02/28/2022 Genetic Testing   Negative hereditary cancer genetic testing: no pathogenic variants detected in [facility] [employer].  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  Report dates are 02/28/2022 and Mar 04, 2022.   The CustomNext-Cancer+RNAinsight panel offered by [employer] includes sequencing and rearrangement analysis for the following 47 genes:  APC, ATM, AXIN2, BARD1, BMPR1A, BRCA1, BRCA2, BRIP1, CDH1, CDK4, CDKN2A, CHEK2, DICER1, EPCAM, GREM1, HOXB13, MEN1, MLH1,  MSH2, MSH3, MSH6, MUTYH, NBN, NF1, NF2, NTHL1, PALB2, PMS2, POLD1, POLE, PTEN, RAD51C, RAD51D, RECQL, RET, SDHA, SDHAF2, SDHB, SDHC, SDHD, SMAD4, SMARCA4, STK11, TP53, TSC1, TSC2, and VHL.  RNA data is routinely analyzed for use in variant interpretation for all genes.   04/04/2022 Surgery   Right lumpectomy: Grade 2 IDC 3.7 cm, with DCIS intermediate grade, 0/4 lymph nodes negative, inferior margin was removed through mammoplasty and therefore all margins are clear.  ER 100%, PR 100%, Ki-67 equivocal by IHC negative by FISH, Ki-67 30%   04/14/2022 Oncotype testing   Oncotype score 10, risk of recurrence: 3%   05/16/2022 - 07/03/2022 Radiation Therapy   Site Technique Total Dose (Gy) Dose per Fx (Gy) Completed Fx Beam Energies  Breast, Right: Breast_R 3D 50.4/50.4 1.8 28/28 6XFFF  Breast, Right: Breast_R_Bst 3D 10/10 2 5/5 6X     07/31/2022 -  Anti-estrogen oral therapy   Tamoxifen     CURRENT THERAPY:Tamoxifen  INTERVAL HISTORY: Grace Harvey 42 y.o. female returns for follow-up of her history of breast cancer.  She continues on tamoxifen daily with good tolerance.  Her most recent mammogram occurred on February 04, 2023 demonstrating no mammographic evidence of malignancy and breast density category C.   Patient Active Problem List   Diagnosis Date Noted   Genetic testing 03/03/2022   Family history of breast cancer 02/19/2022   Family history of prostate cancer 02/19/2022   Anxiety 02/19/2022   Malignant neoplasm of upper-outer quadrant of right breast in female, estrogen receptor positive (HCC) 02/17/2022   SVD 6/15 03/27/2018   Postpartum care following vaginal delivery 03/27/2018   Second-degree perineal laceration, with delivery 03/27/2018    has No Known Allergies.  MEDICAL HISTORY: Past Medical History:  Diagnosis Date   Breast cancer (HCC)    Cervical intraepithelial neoplasm HIGH GRADE   Family history of breast cancer 02/19/2022   Family history of prostate cancer  02/19/2022   Personal history of radiation therapy     SURGICAL HISTORY: Past Surgical History:  Procedure Laterality Date   BREAST LUMPECTOMY WITH RADIOACTIVE SEED AND SENTINEL LYMPH NODE BIOPSY Right 04/04/2022   Procedure: RIGHT BREAST LUMPECTOMY WITH RADIOACTIVE SEED AND SENTINEL LYMPH NODE BIOPSY;  Surgeon: Abigail Miyamoto, MD;  Location: [facility];  Service: General;  Laterality: Right;   BREAST REDUCTION SURGERY Bilateral 04/04/2022   Procedure: RIGHT SIDE ONCOPLASTIC BREAST REDUCTION, LEFT SIDE BREAST REDUCTION FOR SYMMETRY;  Surgeon: Allena Napoleon, MD;  Location: [facility];  Service: Plastics;  Laterality: Bilateral;   INTRAUTERINE DEVICE (IUD) INSERTION     mirena iud insertion 12-23-19   IUD REMOVAL  03/02/2012   MIRENA   laser vaporization of cervix  10/14/2011   REDUCTION MAMMAPLASTY     RESET FX RIGHT ARM  AGE 19   WISDOM TOOTH EXTRACTION  03/04/2012   ORAL SURGEON OFFICE    SOCIAL HISTORY: Social History   Socioeconomic History   Marital status: Married    Spouse name: Not on file   Number of children: Not on file   Years of education: Not on file   Highest education level: Not on file  Occupational History   Not on file  Tobacco Use   Smoking status: Never   Smokeless tobacco: Never  Vaping Use   Vaping Use: Never used  Substance and Sexual Activity   Alcohol use: No    Alcohol/week: 0.0 standard drinks of alcohol   Drug use: No   Sexual activity: Yes    Partners: Male    Birth control/protection: I.U.D.  Other Topics Concern   Not on file  Social History Narrative   Not on file   Social Determinants of Health   Financial Resource Strain: Not on file  Food Insecurity: Not on file  Transportation Needs: Not on file  Physical Activity: Not on file  Stress: Not on file  Social Connections: Not on file  Intimate Partner Violence: Not on file    FAMILY HISTORY: Family History  Problem Relation Age of Onset    Hypertension Father    Prostate cancer Father 37   Breast cancer Maternal Aunt 47   Breast cancer Other        MGM's sister; dx 35s    Review of Systems - Oncology    PHYSICAL EXAMINATION    Vitals:   03/24/23 1209  BP: (!) 123/94  Pulse: 84  Resp: 18  Temp: 98.3 F (36.8 C)  SpO2: 98%    Physical Exam  LABORATORY DATA:  CBC    Component Value Date/Time   WBC 6.9 02/19/2022 1212   WBC 18.2 (H) 03/27/2018 0632   RBC 4.83 02/19/2022 1212   HGB 14.5 02/19/2022 1212   HCT 42.6 02/19/2022 1212   PLT 306 02/19/2022 1212   MCV 88.2 02/19/2022 1212   MCH 30.0 02/19/2022 1212   MCHC 34.0 02/19/2022 1212   RDW 11.8 02/19/2022 1212   LYMPHSABS 2.1 02/19/2022 1212   MONOABS 0.5 02/19/2022 1212   EOSABS 0.2 02/19/2022 1212   BASOSABS 0.0 02/19/2022 1212    CMP     Component Value Date/Time   NA 137 02/19/2022 1212   K 3.8 02/19/2022 1212  CL 106 02/19/2022 1212   CO2 24 02/19/2022 1212   GLUCOSE 98 02/19/2022 1212   BUN 19 02/19/2022 1212   CREATININE 0.74 02/19/2022 1212   CREATININE 0.73 06/11/2015 1117   CALCIUM 9.3 02/19/2022 1212   PROT 7.6 02/19/2022 1212   ALBUMIN 4.3 02/19/2022 1212   AST 12 (L) 02/19/2022 1212   ALT 14 02/19/2022 1212   ALKPHOS 45 02/19/2022 1212   BILITOT 0.5 02/19/2022 1212   GFRNONAA >60 02/19/2022 1212       PENDING LABS:   RADIOGRAPHIC STUDIES:  No results found.   PATHOLOGY:     ASSESSMENT and THERAPY PLAN:   No problem-specific Assessment & Plan notes found for this encounter.   No orders of the defined types were placed in this encounter.   All questions were answered. The patient knows to call the clinic with any problems, questions or concerns. We can certainly see the patient much sooner if necessary. This note was electronically signed. Noreene Filbert, NP 03/24/2023

## 2023-03-25 ENCOUNTER — Telehealth: Payer: Self-pay | Admitting: Adult Health

## 2023-03-25 NOTE — Telephone Encounter (Signed)
Patient is aware of upcoming appointment dates/times.  

## 2023-03-25 NOTE — Assessment & Plan Note (Signed)
Grace Harvey is a 42 year old woman with h/o right breast stage IA ER/PR positive invasive ductal carcinoma diagnosed in 03/2022 s/p lumpectomy, adjuvant radiation and antiestrogen therapy with Tamoxifen since 07/2022.    Stage IA right breast cancer: She has no clinical or radiographic signs of breast cancer recurrence.  She will continue on Tamoxifen daily.  Her next mammogram is due 01/2024. Arthralgias: I suggested she consider b12, vitamin d3, and magnesium daily.   3.  Health maintenance: I recommended continued healthy diet, exercise, and continued f/u with her PCP.    Grace Harvey will return in 6 months for f/u with Dr. Pamelia Hoit.

## 2023-04-20 ENCOUNTER — Other Ambulatory Visit (HOSPITAL_COMMUNITY): Payer: Self-pay

## 2023-04-20 DIAGNOSIS — Z79899 Other long term (current) drug therapy: Secondary | ICD-10-CM | POA: Diagnosis not present

## 2023-04-20 DIAGNOSIS — F411 Generalized anxiety disorder: Secondary | ICD-10-CM | POA: Diagnosis not present

## 2023-04-20 DIAGNOSIS — I890 Lymphedema, not elsewhere classified: Secondary | ICD-10-CM | POA: Diagnosis not present

## 2023-04-20 DIAGNOSIS — C50919 Malignant neoplasm of unspecified site of unspecified female breast: Secondary | ICD-10-CM | POA: Diagnosis not present

## 2023-04-20 DIAGNOSIS — Z13220 Encounter for screening for lipoid disorders: Secondary | ICD-10-CM | POA: Diagnosis not present

## 2023-04-20 DIAGNOSIS — Z0000 Encounter for general adult medical examination without abnormal findings: Secondary | ICD-10-CM | POA: Diagnosis not present

## 2023-04-20 MED ORDER — VENLAFAXINE HCL ER 37.5 MG PO CP24
37.5000 mg | ORAL_CAPSULE | Freq: Every day | ORAL | 3 refills | Status: DC
  Filled 2023-04-20 – 2023-05-08 (×2): qty 90, 90d supply, fill #0
  Filled 2023-07-06 – 2023-07-22 (×3): qty 90, 90d supply, fill #1
  Filled 2023-09-13 – 2023-09-18 (×3): qty 90, 90d supply, fill #2

## 2023-04-29 ENCOUNTER — Other Ambulatory Visit (HOSPITAL_COMMUNITY): Payer: Self-pay

## 2023-05-04 ENCOUNTER — Ambulatory Visit: Payer: 59 | Attending: Surgery | Admitting: Rehabilitation

## 2023-05-04 DIAGNOSIS — Z170 Estrogen receptor positive status [ER+]: Secondary | ICD-10-CM | POA: Insufficient documentation

## 2023-05-04 DIAGNOSIS — C50411 Malignant neoplasm of upper-outer quadrant of right female breast: Secondary | ICD-10-CM | POA: Insufficient documentation

## 2023-05-04 DIAGNOSIS — Z483 Aftercare following surgery for neoplasm: Secondary | ICD-10-CM | POA: Insufficient documentation

## 2023-05-04 NOTE — Therapy (Signed)
  OUTPATIENT PHYSICAL THERAPY SOZO SCREENING NOTE   Patient Name: Grace Harvey MRN: [id number] DOB:[birth year], 42 y.o., female Today's Date: 05/04/2023  PCP: Lupita Raider, MD REFERRING PROVIDER: Abigail Miyamoto, MD   PT End of Session - 05/04/23 [phone]     Visit Number 5   screen   PT Start Time 0920    PT Stop Time 0924    PT Time Calculation (min) 4 min    Activity Tolerance Patient tolerated treatment well    Behavior During Therapy [facility] for tasks assessed/performed             Past Medical History:  Diagnosis Date   Breast cancer (HCC)    Cervical intraepithelial neoplasm HIGH GRADE   Family history of breast cancer 02/19/2022   Family history of prostate cancer 02/19/2022   Personal history of radiation therapy    Second-degree perineal laceration, with delivery 03/27/2018   Past Surgical History:  Procedure Laterality Date   BREAST LUMPECTOMY WITH RADIOACTIVE SEED AND SENTINEL LYMPH NODE BIOPSY Right 04/04/2022   Procedure: RIGHT BREAST LUMPECTOMY WITH RADIOACTIVE SEED AND SENTINEL LYMPH NODE BIOPSY;  Surgeon: Abigail Miyamoto, MD;  Location: [facility];  Service: General;  Laterality: Right;   BREAST REDUCTION SURGERY Bilateral 04/04/2022   Procedure: RIGHT SIDE ONCOPLASTIC BREAST REDUCTION, LEFT SIDE BREAST REDUCTION FOR SYMMETRY;  Surgeon: Allena Napoleon, MD;  Location: [facility];  Service: Plastics;  Laterality: Bilateral;   INTRAUTERINE DEVICE (IUD) INSERTION     mirena iud insertion 12-23-19   IUD REMOVAL  03/02/2012   MIRENA   laser vaporization of cervix  10/14/2011   REDUCTION MAMMAPLASTY     RESET FX RIGHT ARM  AGE 36   WISDOM TOOTH EXTRACTION  03/04/2012   ORAL SURGEON OFFICE   Patient Active Problem List   Diagnosis Date Noted   Genetic testing 03/03/2022   Family history of breast cancer 02/19/2022   Family history of prostate cancer 02/19/2022   Anxiety 02/19/2022   Malignant neoplasm of upper-outer quadrant  of right breast in female, estrogen receptor positive (HCC) 02/17/2022    REFERRING DIAG: right breast cancer at risk for lymphedema  THERAPY DIAG:  Aftercare following surgery for neoplasm  Malignant neoplasm of upper-outer quadrant of right breast in female, estrogen receptor positive (HCC)  PERTINENT HISTORY:  right UE Lymphedema risk, None   PRECAUTIONS: right UE Lymphedema risk, None  SUBJECTIVE: Pt returns for her 3 month L-Dex screen.   PAIN:  Are you having pain? No  SOZO SCREENING:  Patient was assessed today using the SOZO machine to determine the lymphedema index score. This was compared to her baseline score. It was determined that she is again in the green range but only by 0.1 and will start to wear her sleeve on an as needed basis but more frequently.     L-DEX FLOWSHEETS - 05/04/23 0900       L-DEX LYMPHEDEMA SCREENING   Measurement Type Unilateral    L-DEX MEASUREMENT EXTREMITY Upper Extremity    POSITION  Standing    DOMINANT SIDE Right    At Risk Side Right    BASELINE SCORE (UNILATERAL) -4.5    L-DEX SCORE (UNILATERAL) 1.9    VALUE CHANGE (UNILAT) 6.4    Comment pt will start wearing sleeve again and recheck in 1 - 2 months              Fiza Nation R, PT 05/04/2023, 9:25 AM

## 2023-05-08 ENCOUNTER — Other Ambulatory Visit (HOSPITAL_COMMUNITY): Payer: Self-pay

## 2023-05-09 ENCOUNTER — Other Ambulatory Visit (HOSPITAL_COMMUNITY): Payer: Self-pay

## 2023-05-15 ENCOUNTER — Other Ambulatory Visit (HOSPITAL_COMMUNITY): Payer: Self-pay

## 2023-05-16 ENCOUNTER — Other Ambulatory Visit (HOSPITAL_COMMUNITY): Payer: Self-pay

## 2023-06-09 ENCOUNTER — Ambulatory Visit: Payer: 59 | Attending: Surgery

## 2023-06-09 VITALS — Wt 189.4 lb

## 2023-06-09 DIAGNOSIS — Z483 Aftercare following surgery for neoplasm: Secondary | ICD-10-CM | POA: Insufficient documentation

## 2023-06-09 NOTE — Therapy (Signed)
OUTPATIENT PHYSICAL THERAPY SOZO SCREENING NOTE   Patient Name: Grace Harvey MRN: [id number] DOB:08-Oct-1981, 42 y.o., female Today's Date: 06/09/2023  PCP: Lupita Raider, MD REFERRING PROVIDER: Abigail Miyamoto, MD   PT End of Session - 06/09/23 1156     Visit Number 5   # unchanged due to screen only   PT Start Time 1153    PT Stop Time 1158    PT Time Calculation (min) 5 min    Activity Tolerance Patient tolerated treatment well    Behavior During Therapy [facility] for tasks assessed/performed             Past Medical History:  Diagnosis Date   Breast cancer (HCC)    Cervical intraepithelial neoplasm HIGH GRADE   Family history of breast cancer 02/19/2022   Family history of prostate cancer 02/19/2022   Personal history of radiation therapy    Second-degree perineal laceration, with delivery 03/27/2018   Past Surgical History:  Procedure Laterality Date   BREAST LUMPECTOMY WITH RADIOACTIVE SEED AND SENTINEL LYMPH NODE BIOPSY Right 04/04/2022   Procedure: RIGHT BREAST LUMPECTOMY WITH RADIOACTIVE SEED AND SENTINEL LYMPH NODE BIOPSY;  Surgeon: Abigail Miyamoto, MD;  Location: [facility];  Service: General;  Laterality: Right;   BREAST REDUCTION SURGERY Bilateral 04/04/2022   Procedure: RIGHT SIDE ONCOPLASTIC BREAST REDUCTION, LEFT SIDE BREAST REDUCTION FOR SYMMETRY;  Surgeon: Allena Napoleon, MD;  Location: [facility];  Service: Plastics;  Laterality: Bilateral;   INTRAUTERINE DEVICE (IUD) INSERTION     mirena iud insertion 12-23-19   IUD REMOVAL  03/02/2012   MIRENA   laser vaporization of cervix  10/14/2011   REDUCTION MAMMAPLASTY     RESET FX RIGHT ARM  AGE 27   WISDOM TOOTH EXTRACTION  03/04/2012   ORAL SURGEON OFFICE   Patient Active Problem List   Diagnosis Date Noted   Genetic testing 03/03/2022   Family history of breast cancer 02/19/2022   Family history of prostate cancer 02/19/2022   Anxiety 02/19/2022   Malignant neoplasm  of upper-outer quadrant of right breast in female, estrogen receptor positive (HCC) 02/17/2022    REFERRING DIAG: right breast cancer at risk for lymphedema  THERAPY DIAG:  Aftercare following surgery for neoplasm  PERTINENT HISTORY:  right UE Lymphedema risk, None   PRECAUTIONS: right UE Lymphedema risk, None  SUBJECTIVE: Pt returns for her 1 month L-Dex screen after having a near high change from baseline (was 6.4).  PAIN:  Are you having pain? No  SOZO SCREENING:  Patient was assessed today using the SOZO machine to determine the lymphedema index score. This was compared to her baseline score. It was determined that she is within the recommended range when compared to her baseline and no further action is needed at this time. She will continue SOZO screenings. These are done every 3 months for 2 years post operatively followed by every 6 months for 2 years, and then annually.   L-DEX FLOWSHEETS - 06/09/23 1200       L-DEX LYMPHEDEMA SCREENING   Measurement Type Unilateral    L-DEX MEASUREMENT EXTREMITY Upper Extremity    POSITION  Standing    DOMINANT SIDE Right    At Risk Side Right    BASELINE SCORE (UNILATERAL) -4.5    L-DEX SCORE (UNILATERAL) -0.2    VALUE CHANGE (UNILAT) 4.3              Hermenia Bers, PTA 06/09/2023, 12:01 PM

## 2023-06-18 IMAGING — MG MM BREAST LOCALIZATION CLIP
4 series · 4 of 12 positions shown · non-contrast
Comparison: Previous exam(s).

CLINICAL DATA: Status post ultrasound-guided biopsy right breast
mass

EXAM:
3D DIAGNOSTIC RIGHT MAMMOGRAM POST ULTRASOUND BIOPSY

[R CC synth-2D]
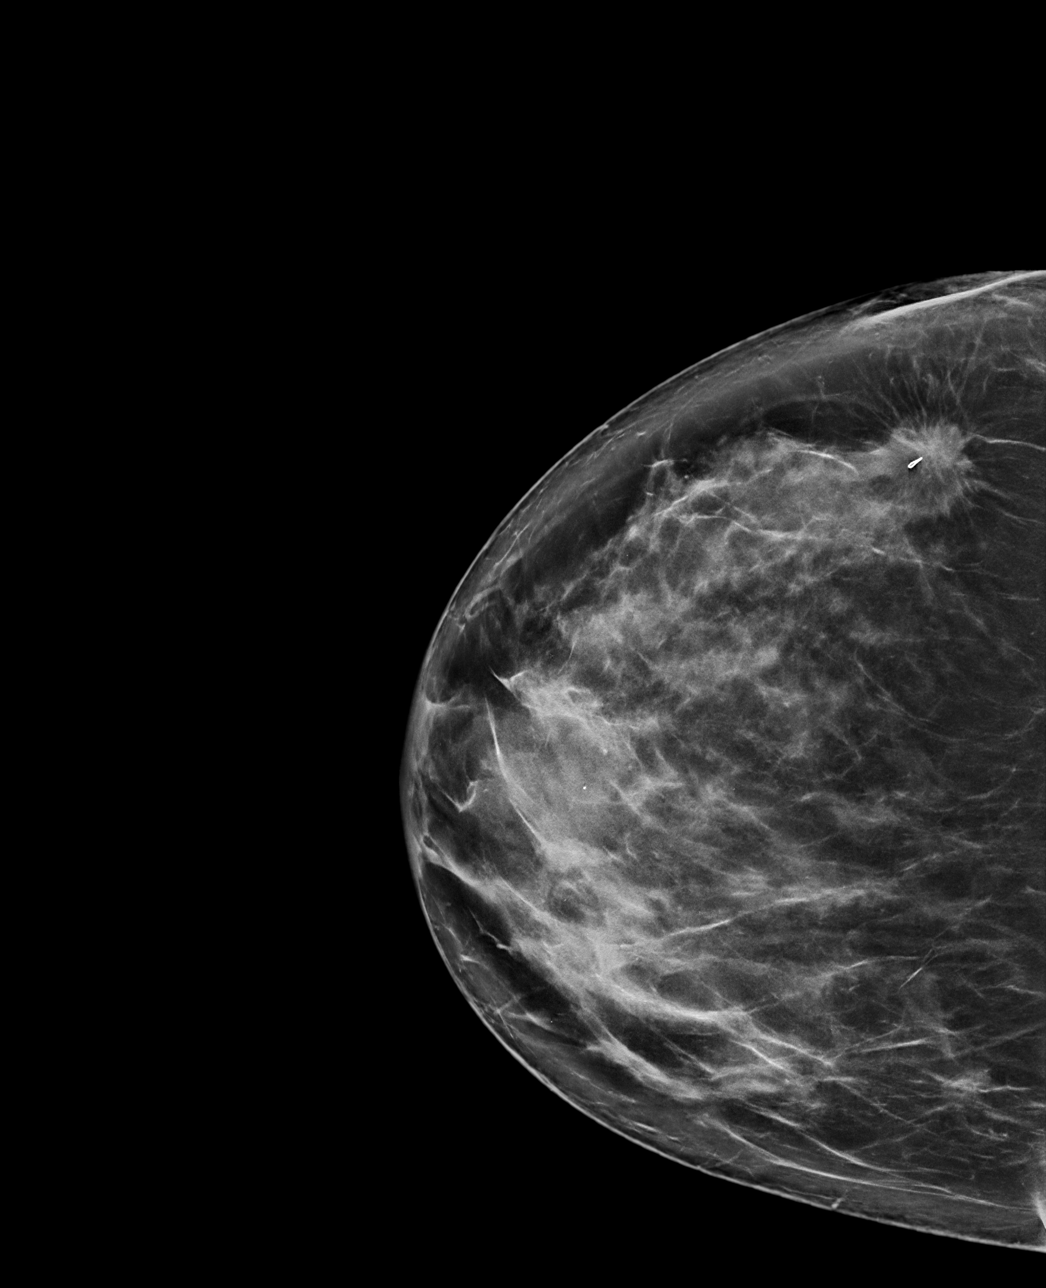

[R ML synth-2D]
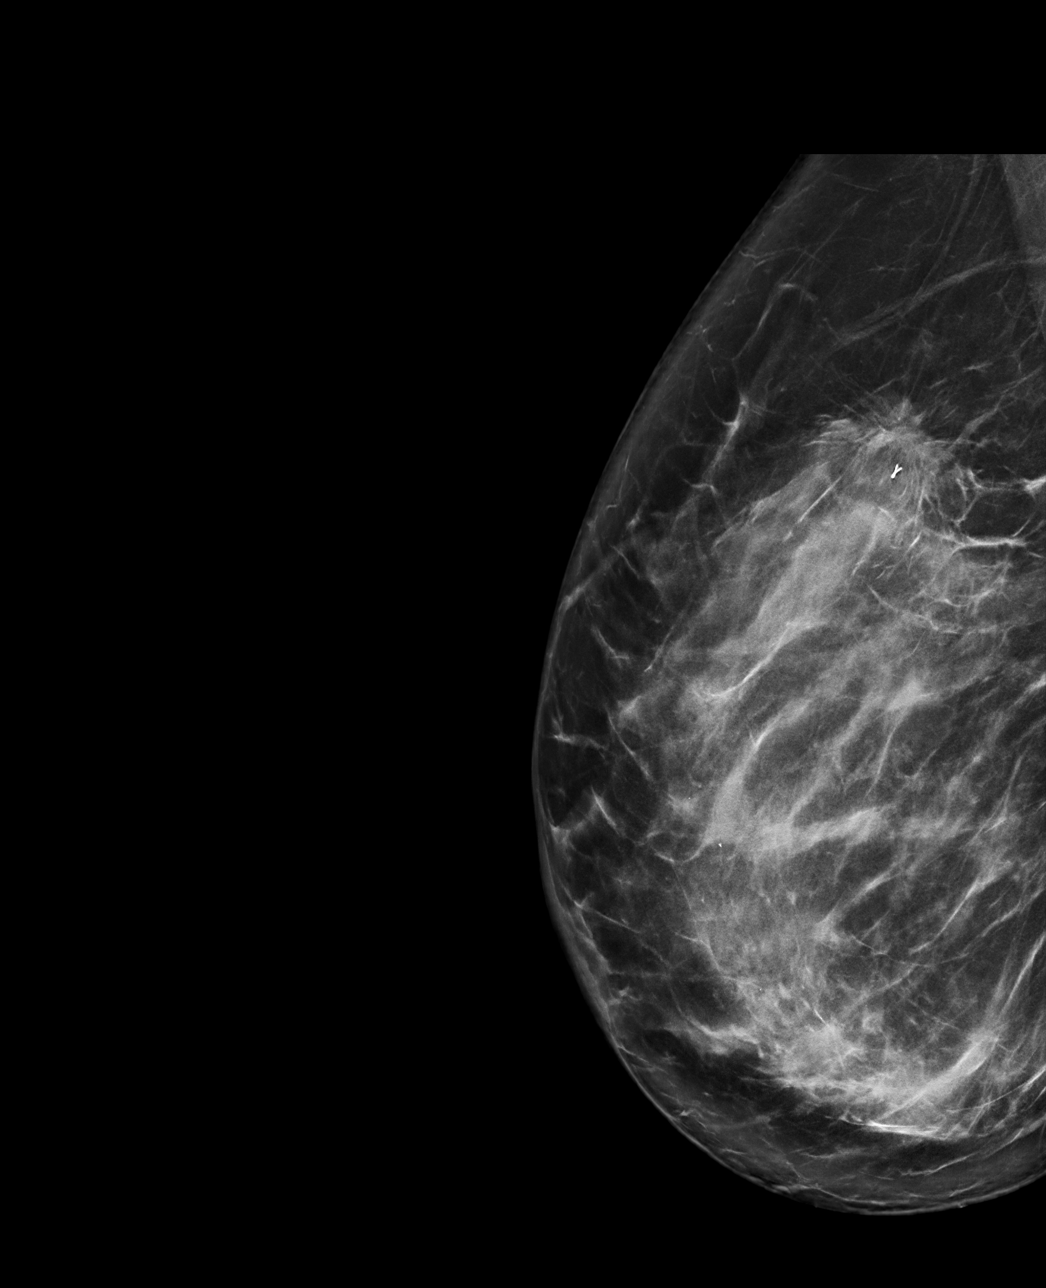

[R CC tomo · tomo slice 49/97.0]
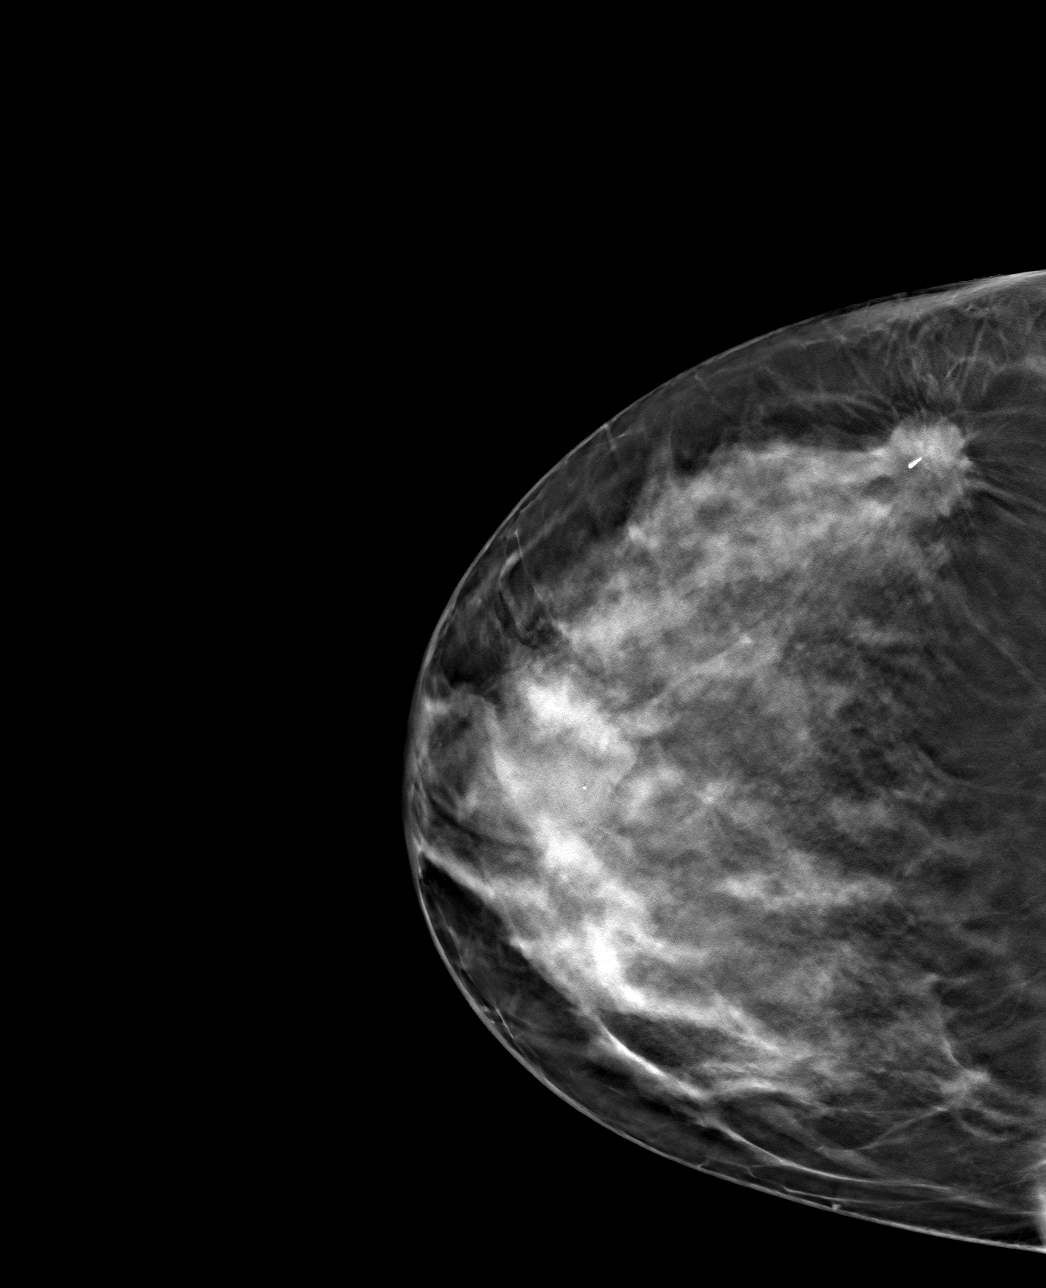

[R ML tomo · tomo slice 49/98.0]
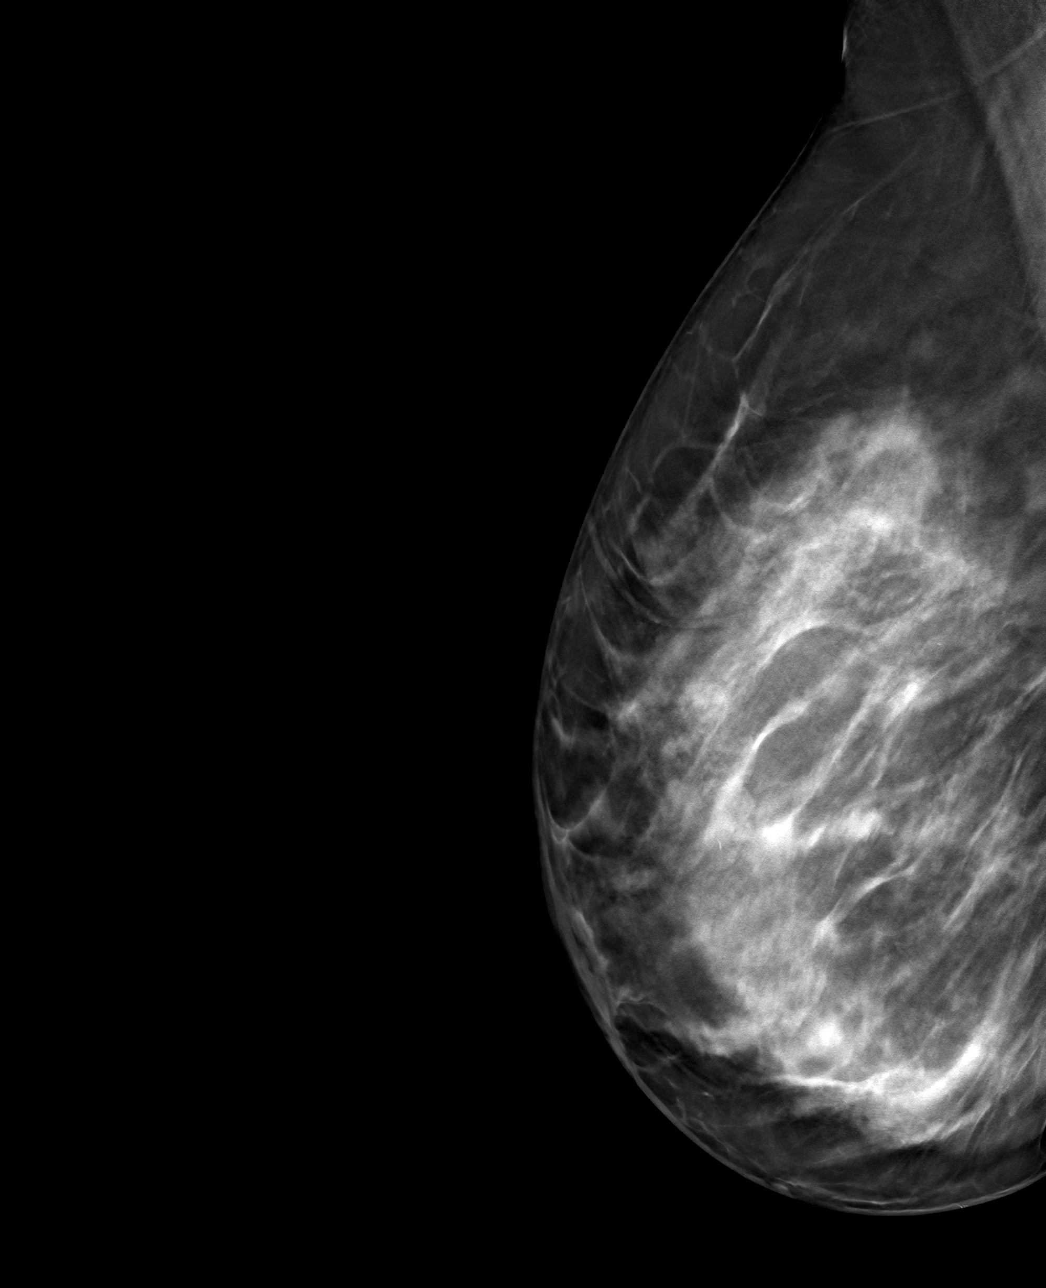

[4 of 12 positions shown; findings below may reference images not displayed]

FINDINGS: 3D Mammographic images were obtained following ultrasound guided
biopsy of right breast mass. The biopsy marking clip is in expected
position at the site of biopsy.
IMPRESSION: Appropriate positioning of the ribbon shaped biopsy marking clip at
the site of biopsy in the upper-outer right breast.

Final Assessment: Post Procedure Mammograms for Marker Placement

## 2023-07-06 ENCOUNTER — Other Ambulatory Visit: Payer: Self-pay | Admitting: Hematology and Oncology

## 2023-07-07 ENCOUNTER — Other Ambulatory Visit (HOSPITAL_COMMUNITY): Payer: Self-pay

## 2023-07-07 ENCOUNTER — Other Ambulatory Visit: Payer: Self-pay

## 2023-07-07 MED ORDER — TAMOXIFEN CITRATE 20 MG PO TABS
20.0000 mg | ORAL_TABLET | Freq: Every day | ORAL | 3 refills | Status: DC
  Filled 2023-07-07 – 2023-08-09 (×2): qty 90, 90d supply, fill #0
  Filled 2023-11-07 – 2023-12-04 (×2): qty 90, 90d supply, fill #1
  Filled 2024-02-27: qty 90, 90d supply, fill #2
  Filled 2024-05-27 – 2024-06-13 (×2): qty 90, 90d supply, fill #3

## 2023-07-15 ENCOUNTER — Other Ambulatory Visit (HOSPITAL_COMMUNITY): Payer: Self-pay

## 2023-07-23 ENCOUNTER — Other Ambulatory Visit (HOSPITAL_COMMUNITY): Payer: Self-pay

## 2023-08-10 ENCOUNTER — Other Ambulatory Visit (HOSPITAL_COMMUNITY): Payer: Self-pay

## 2023-08-10 ENCOUNTER — Other Ambulatory Visit: Payer: Self-pay

## 2023-08-10 IMAGING — MG MM BREAST SURGICAL SPECIMEN
1 series · 2 of 2 positions shown · non-contrast
Comparison: Prior studies.

CLINICAL DATA: Status post seed localized lumpectomy of the RIGHT
breast.

EXAM:
SPECIMEN RADIOGRAPH OF THE RIGHT BREAST

[Series 2: R · right · 0.07mm/px · 2 of 2 slices shown]
[im 1/2]
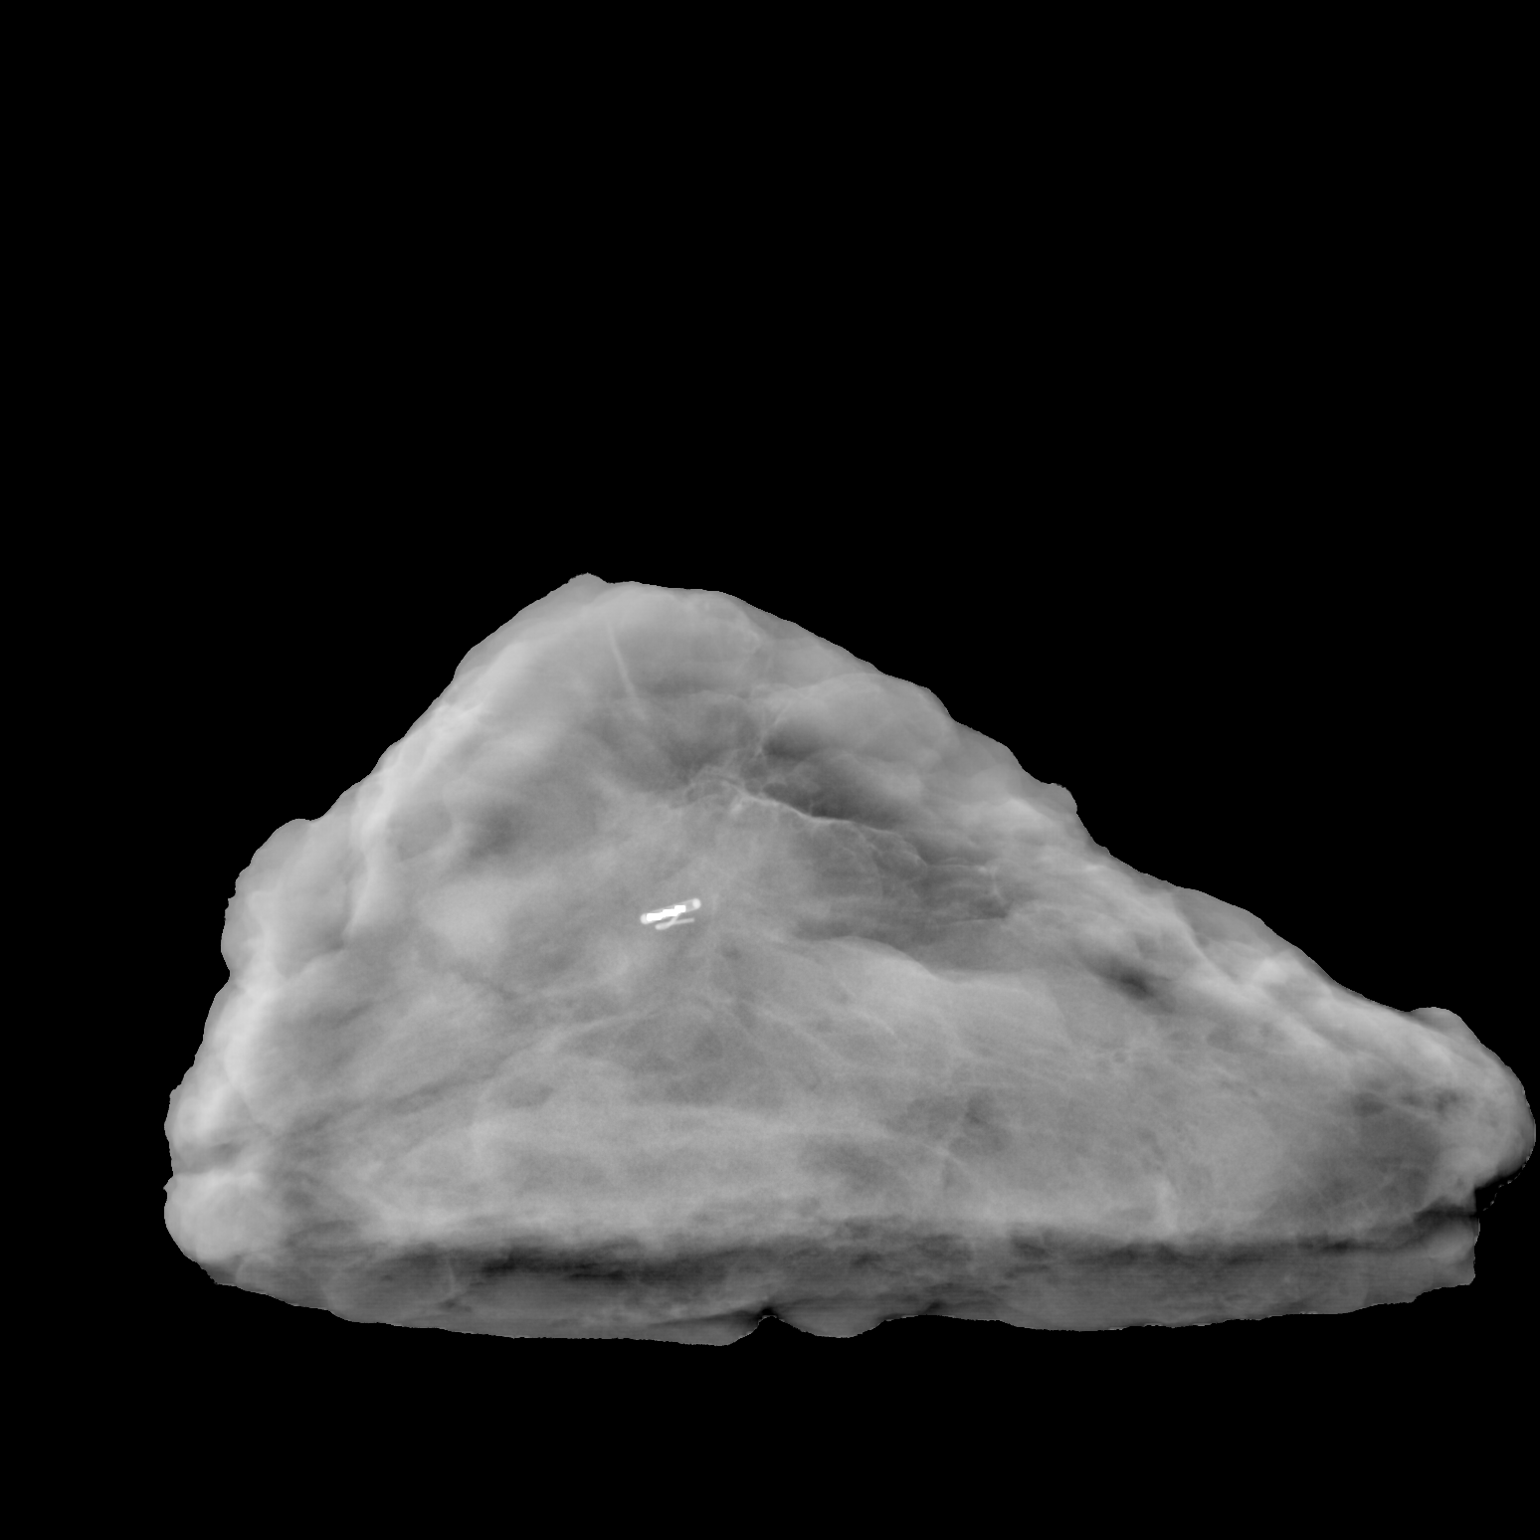
[im 2/2]
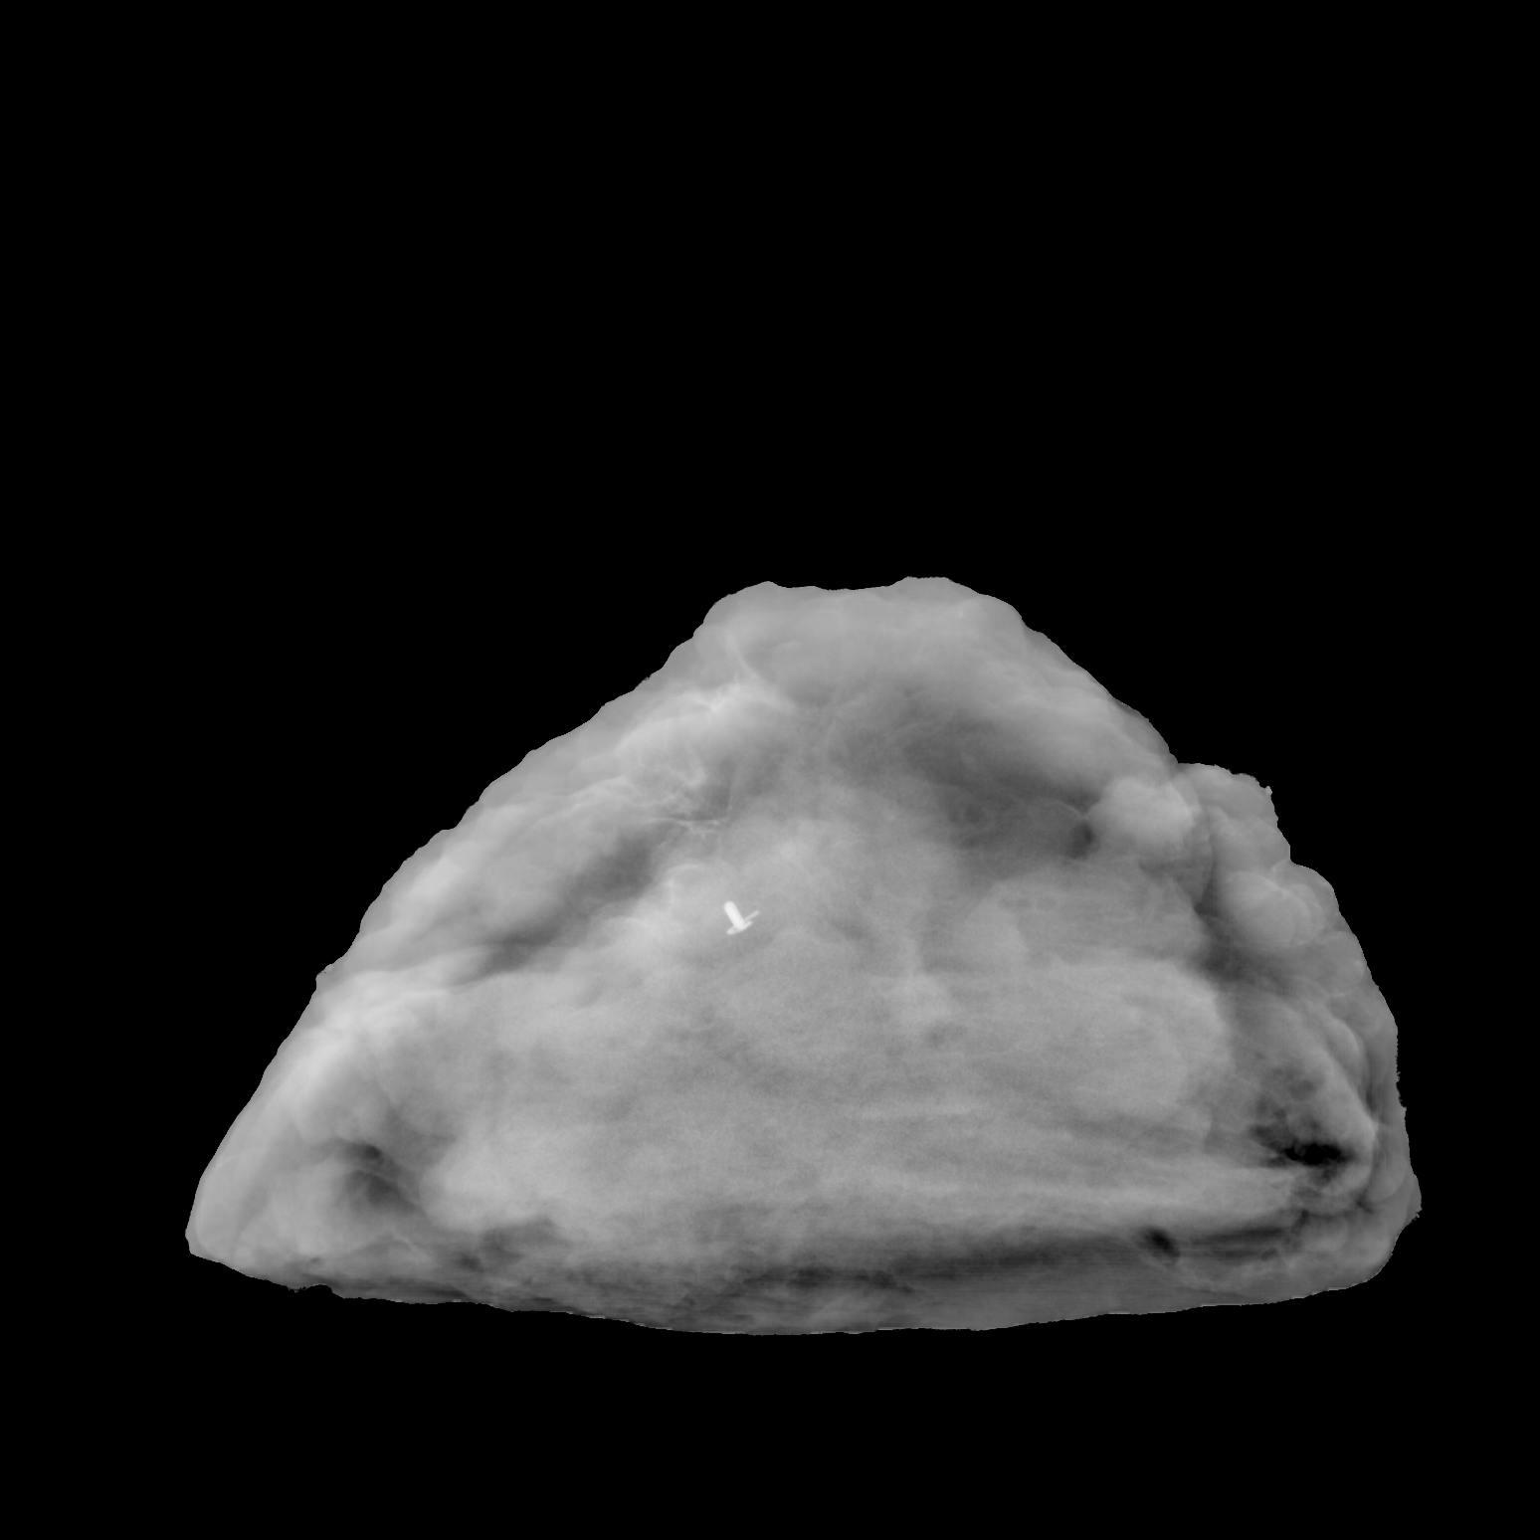

[2 of 2 positions shown; findings below may reference images not displayed]

FINDINGS: Status post excision of the right ribbon shaped breast. The
radioactive seed and biopsy marker clip are present and completely
intact. Findings are discussed with the operating room nurse at the
time of interpretation.
IMPRESSION: Specimen radiograph of the right breast.

## 2023-08-13 ENCOUNTER — Other Ambulatory Visit (HOSPITAL_COMMUNITY): Payer: Self-pay

## 2023-09-07 ENCOUNTER — Ambulatory Visit: Payer: 59 | Attending: Surgery

## 2023-09-07 VITALS — Wt 189.5 lb

## 2023-09-07 DIAGNOSIS — Z483 Aftercare following surgery for neoplasm: Secondary | ICD-10-CM | POA: Insufficient documentation

## 2023-09-07 NOTE — Therapy (Signed)
OUTPATIENT PHYSICAL THERAPY SOZO SCREENING NOTE   Patient Name: Grace Harvey MRN: [id number] DOB:[birth year], 42 y.o., female Today's Date: 09/07/2023  PCP: Lupita Raider, MD REFERRING PROVIDER: Abigail Miyamoto, MD   PT End of Session - 09/07/23 1549     Visit Number 5   # unchanged due to screen only   PT Start Time 1547    PT Stop Time 1551    PT Time Calculation (min) 4 min    Activity Tolerance Patient tolerated treatment well    Behavior During Therapy [facility] for tasks assessed/performed             Past Medical History:  Diagnosis Date   Breast cancer (HCC)    Cervical intraepithelial neoplasm HIGH GRADE   Family history of breast cancer 02/19/2022   Family history of prostate cancer 02/19/2022   Personal history of radiation therapy    Second-degree perineal laceration, with delivery 03/27/2018   Past Surgical History:  Procedure Laterality Date   BREAST LUMPECTOMY WITH RADIOACTIVE SEED AND SENTINEL LYMPH NODE BIOPSY Right 04/04/2022   Procedure: RIGHT BREAST LUMPECTOMY WITH RADIOACTIVE SEED AND SENTINEL LYMPH NODE BIOPSY;  Surgeon: Abigail Miyamoto, MD;  Location: [facility];  Service: General;  Laterality: Right;   BREAST REDUCTION SURGERY Bilateral 04/04/2022   Procedure: RIGHT SIDE ONCOPLASTIC BREAST REDUCTION, LEFT SIDE BREAST REDUCTION FOR SYMMETRY;  Surgeon: Allena Napoleon, MD;  Location: [facility];  Service: Plastics;  Laterality: Bilateral;   INTRAUTERINE DEVICE (IUD) INSERTION     mirena iud insertion 12-23-19   IUD REMOVAL  03/02/2012   MIRENA   laser vaporization of cervix  10/14/2011   REDUCTION MAMMAPLASTY     RESET FX RIGHT ARM  AGE 54   WISDOM TOOTH EXTRACTION  03/04/2012   ORAL SURGEON OFFICE   Patient Active Problem List   Diagnosis Date Noted   Genetic testing 03/03/2022   Family history of breast cancer 02/19/2022   Family history of prostate cancer 02/19/2022   Anxiety 02/19/2022   Malignant neoplasm  of upper-outer quadrant of right breast in female, estrogen receptor positive (HCC) 02/17/2022    REFERRING DIAG: right breast cancer at risk for lymphedema  THERAPY DIAG:  Aftercare following surgery for neoplasm  PERTINENT HISTORY:  right UE Lymphedema risk, None   PRECAUTIONS: right UE Lymphedema risk, None  SUBJECTIVE: Pt returns for her 3 month L-Dex screen. "I have been feeling so good. I went a second job interview with a company I am really excited about. I've also been doing pilates and it has helped to break down my scar tissue and I just am feeling so good overall!"  PAIN:  Are you having pain? No  SOZO SCREENING:  Patient was assessed today using the SOZO machine to determine the lymphedema index score. This was compared to her baseline score. It was determined that she is within the recommended range when compared to her baseline and no further action is needed at this time. She will continue SOZO screenings. These are done every 3 months for 2 years post operatively followed by every 6 months for 2 years, and then annually.   L-DEX FLOWSHEETS - 09/07/23 1500       L-DEX LYMPHEDEMA SCREENING   Measurement Type Unilateral    L-DEX MEASUREMENT EXTREMITY Upper Extremity    POSITION  Standing    DOMINANT SIDE Right    At Risk Side Right    BASELINE SCORE (UNILATERAL) -4.5    L-DEX SCORE (  UNILATERAL) -3.4    VALUE CHANGE (UNILAT) 1.1              Hermenia Bers, PTA 09/07/2023, 3:50 PM

## 2023-09-14 ENCOUNTER — Other Ambulatory Visit (HOSPITAL_COMMUNITY): Payer: Self-pay

## 2023-09-17 ENCOUNTER — Other Ambulatory Visit: Payer: Self-pay

## 2023-09-18 ENCOUNTER — Other Ambulatory Visit (HOSPITAL_COMMUNITY): Payer: Self-pay

## 2023-09-21 ENCOUNTER — Other Ambulatory Visit (HOSPITAL_COMMUNITY): Payer: Self-pay

## 2023-09-22 ENCOUNTER — Other Ambulatory Visit: Payer: Self-pay

## 2023-09-22 ENCOUNTER — Other Ambulatory Visit (HOSPITAL_COMMUNITY): Payer: Self-pay

## 2023-09-22 MED ORDER — VENLAFAXINE HCL ER 75 MG PO CP24
75.0000 mg | ORAL_CAPSULE | Freq: Every day | ORAL | 3 refills | Status: DC
  Filled 2023-12-16: qty 90, 90d supply, fill #0
  Filled 2024-02-27: qty 90, 90d supply, fill #1
  Filled 2024-06-13: qty 90, 90d supply, fill #2
  Filled 2024-09-09: qty 90, 90d supply, fill #3

## 2023-09-22 MED ORDER — VENLAFAXINE HCL ER 75 MG PO CP24
75.0000 mg | ORAL_CAPSULE | Freq: Every day | ORAL | 3 refills | Status: DC
  Filled 2023-09-22 (×2): qty 90, 90d supply, fill #0

## 2023-09-24 ENCOUNTER — Inpatient Hospital Stay: Payer: 59 | Attending: Hematology and Oncology | Admitting: Hematology and Oncology

## 2023-09-24 VITALS — BP 139/86 | HR 87 | Temp 98.2°F | Resp 18 | Ht 64.75 in | Wt 190.7 lb

## 2023-09-24 DIAGNOSIS — Z79899 Other long term (current) drug therapy: Secondary | ICD-10-CM | POA: Diagnosis not present

## 2023-09-24 DIAGNOSIS — C50411 Malignant neoplasm of upper-outer quadrant of right female breast: Secondary | ICD-10-CM | POA: Diagnosis not present

## 2023-09-24 DIAGNOSIS — Z170 Estrogen receptor positive status [ER+]: Secondary | ICD-10-CM | POA: Diagnosis not present

## 2023-09-24 DIAGNOSIS — Z79810 Long term (current) use of selective estrogen receptor modulators (SERMs): Secondary | ICD-10-CM | POA: Insufficient documentation

## 2023-09-24 DIAGNOSIS — Z923 Personal history of irradiation: Secondary | ICD-10-CM | POA: Diagnosis not present

## 2023-09-24 NOTE — Assessment & Plan Note (Signed)
02/10/2022:Palpable right breast mass UOQ spiculated mass at 10 o'clock position 1.8 cm, axilla negative, ultrasound-guided biopsy: Grade 2 IDC, ER 100%, PR 100%, HER2 equivocal by IHC, FISH negative, Ki-67 30%   04/04/2022:Right lumpectomy: Grade 2 IDC 3.7 cm, with DCIS intermediate grade, 0/4 lymph nodes negative, inferior margin was removed through mammoplasty and therefore all margins are clear.  ER 100%, PR 100%, Ki-67 equivocal by IHC negative by FISH, Ki-67 30%   Oncotype DX score 10: Distant recurrence: 3%   Adjuvant radiation therapy: 05/16/2022-07/03/2022 Adjuvant tamoxifen started 07/31/2022   Tamoxifen toxicities:  Breast cancer surveillance: Breast exam 09/24/2023: Benign Mammogram 02/09/2023: Benign breast density category C  Return to clinic in 1 year for follow-up

## 2023-09-24 NOTE — Progress Notes (Signed)
Patient Care Team: Lupita Raider, MD as PCP - General (Family Medicine) Abigail Miyamoto, MD as Consulting Physician (General Surgery) Serena Croissant, MD as Consulting Physician (Hematology and Oncology) Dorothy Puffer, MD as Consulting Physician (Radiation Oncology)  DIAGNOSIS:  Encounter Diagnosis  Name Primary?   Malignant neoplasm of upper-outer quadrant of right breast in female, estrogen receptor positive (HCC) Yes    SUMMARY OF ONCOLOGIC HISTORY: Oncology History  Malignant neoplasm of upper-outer quadrant of right breast in female, estrogen receptor positive (HCC)  02/10/2022 Initial Diagnosis   Palpable right breast mass UOQ spiculated mass at 10 o'clock position 1.8 cm, axilla negative, ultrasound-guided biopsy: Grade 2 IDC, ER 100%, PR 100%, HER2 equivocal by IHC, FISH negative, Ki-67 30%   02/19/2022 Cancer Staging   Staging form: Breast, AJCC 8th Edition - Clinical: Stage IA (cT1c, cN0, cM0, G2, ER+, PR+, HER2-) - Signed by Serena Croissant, MD on 02/19/2022 Stage prefix: Initial diagnosis Histologic grading system: 3 grade system   02/28/2022 Genetic Testing   Negative hereditary cancer genetic testing: no pathogenic variants detected in [employer].  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  Report dates are 02/28/2022 and Mar 04, 2022.   The CustomNext-Cancer+RNAinsight panel offered by [employer] includes sequencing and rearrangement analysis for the following 47 genes:  APC, ATM, AXIN2, BARD1, BMPR1A, BRCA1, BRCA2, BRIP1, CDH1, CDK4, CDKN2A, CHEK2, DICER1, EPCAM, GREM1, HOXB13, MEN1, MLH1, MSH2, MSH3, MSH6, MUTYH, NBN, NF1, NF2, NTHL1, PALB2, PMS2, POLD1, POLE, PTEN, RAD51C, RAD51D, RECQL, RET, SDHA, SDHAF2, SDHB, SDHC, SDHD, SMAD4, SMARCA4, STK11, TP53, TSC1, TSC2, and VHL.  RNA data is routinely analyzed for use in variant interpretation for all genes.   04/04/2022 Surgery   Right lumpectomy: Grade 2 IDC 3.7 cm, with DCIS  intermediate grade, 0/4 lymph nodes negative, inferior margin was removed through mammoplasty and therefore all margins are clear.  ER 100%, PR 100%, Ki-67 equivocal by IHC negative by FISH, Ki-67 30%   04/14/2022 Oncotype testing   Oncotype score 10, risk of recurrence: 3%   05/16/2022 - 07/03/2022 Radiation Therapy   Site Technique Total Dose (Gy) Dose per Fx (Gy) Completed Fx Beam Energies  Breast, Right: Breast_R 3D 50.4/50.4 1.8 28/28 6XFFF  Breast, Right: Breast_R_Bst 3D 10/10 2 5/5 6X     07/31/2022 -  Anti-estrogen oral therapy   Tamoxifen     CHIEF COMPLIANT:   HISTORY OF PRESENT ILLNESS: Discussed the use of AI scribe software for clinical note transcription with the patient, who gave verbal consent to proceed.  History of Present Illness   The patient, with a history of cancer and currently on tamoxifen and Effexor, presents with numbness in the extremities and shaking hands. The numbness is intermittent and seems to be related to certain positions. The patient also experiences this numbness in the knee and ankle, and all fingers. The patient has been taking magnesium nightly with the tamoxifen. The patient also reports cramps in the left calf. The patient has a history of back pain, which has improved with a new exercise routine. The patient also mentions quitting a long-term job recently, which may be contributing to stress levels.         ALLERGIES:  has no known allergies.  MEDICATIONS:  Current Outpatient Medications  Medication Sig Dispense Refill   tamoxifen (NOLVADEX) 20 MG tablet Take 1 tablet (20 mg total) by mouth daily. 90 tablet 3   venlafaxine XR (EFFEXOR-XR) 75 MG 24 hr capsule Take 1 capsule (75  mg total) by mouth daily with food. 90 capsule 3   No current facility-administered medications for this visit.    PHYSICAL EXAMINATION: ECOG PERFORMANCE STATUS: 1 - Symptomatic but completely ambulatory  Vitals:   09/24/23 1456  BP: 139/86  Pulse: 87  Resp:  18  Temp: 98.2 F (36.8 C)  SpO2: 100%   Filed Weights   09/24/23 1456  Weight: 190 lb 11.2 oz (86.5 kg)    Physical Exam          (exam performed in the presence of a chaperone)  LABORATORY DATA:  I have reviewed the data as listed    Latest Ref Rng & Units 02/19/2022   12:12 PM 06/11/2015   11:17 AM  CMP  Glucose 70 - 99 mg/dL 98  85   BUN 6 - 20 mg/dL 19  11   Creatinine 0.98 - 1.00 mg/dL 1.19  1.47   Sodium 829 - 145 mmol/L 137  141   Potassium 3.5 - 5.1 mmol/L 3.8  4.3   Chloride 98 - 111 mmol/L 106  105   CO2 22 - 32 mmol/L 24  26   Calcium 8.9 - 10.3 mg/dL 9.3  9.2   Total Protein 6.5 - 8.1 g/dL 7.6  7.0   Total Bilirubin 0.3 - 1.2 mg/dL 0.5  0.5   Alkaline Phos 38 - 126 U/L 45  46   AST 15 - 41 U/L 12  12   ALT 0 - 44 U/L 14  9     Lab Results  Component Value Date   WBC 6.9 02/19/2022   HGB 14.5 02/19/2022   HCT 42.6 02/19/2022   MCV 88.2 02/19/2022   PLT 306 02/19/2022   NEUTROABS 4.0 02/19/2022    ASSESSMENT & PLAN:  Malignant neoplasm of upper-outer quadrant of right breast in female, estrogen receptor positive (HCC) 02/10/2022:Palpable right breast mass UOQ spiculated mass at 10 o'clock position 1.8 cm, axilla negative, ultrasound-guided biopsy: Grade 2 IDC, ER 100%, PR 100%, HER2 equivocal by IHC, FISH negative, Ki-67 30%   04/04/2022:Right lumpectomy: Grade 2 IDC 3.7 cm, with DCIS intermediate grade, 0/4 lymph nodes negative, inferior margin was removed through mammoplasty and therefore all margins are clear.  ER 100%, PR 100%, Ki-67 equivocal by IHC negative by FISH, Ki-67 30%   Oncotype DX score 10: Distant recurrence: 3%   Adjuvant radiation therapy: 05/16/2022-07/03/2022 Adjuvant tamoxifen started 07/31/2022   Tamoxifen toxicities: Paresthesias: Numbness in the hands and feet: I suspect that the symptoms may be related to Effexor or not tamoxifen. Tremors: Also probably related to Effexor.  We cannot lower the dosage of Effexor because she  needs it for emotional balance and lower doses made her cry significantly.  Breast cancer surveillance: Mammogram 02/09/2023: Benign breast density category C  Return to clinic in 1 year for follow-up       No orders of the defined types were placed in this encounter.  The patient has a good understanding of the overall plan. she agrees with it. she will call with any problems that may develop before the next visit here. Total time spent: 30 mins including face to face time and time spent for planning, charting and co-ordination of care   Tamsen Meek, MD 09/24/23

## 2023-11-20 ENCOUNTER — Other Ambulatory Visit: Payer: Self-pay

## 2023-11-30 ENCOUNTER — Other Ambulatory Visit (HOSPITAL_COMMUNITY): Payer: Self-pay

## 2023-12-04 ENCOUNTER — Other Ambulatory Visit (HOSPITAL_COMMUNITY): Payer: Self-pay

## 2023-12-07 ENCOUNTER — Ambulatory Visit: Payer: 59 | Attending: Surgery | Admitting: Physical Therapy

## 2023-12-07 DIAGNOSIS — Z483 Aftercare following surgery for neoplasm: Secondary | ICD-10-CM | POA: Insufficient documentation

## 2023-12-07 NOTE — Therapy (Signed)
  OUTPATIENT PHYSICAL THERAPY SOZO SCREENING NOTE   Patient Name: Grace Harvey MRN: [id number] DOB:Jan 16, 1981, 43 y.o., female Today's Date: 12/07/2023  PCP: Lupita Raider, MD REFERRING PROVIDER: Abigail Miyamoto, MD   PT End of Session - 12/07/23 1559     Visit Number 5   unchanged due to screen only   PT Start Time 1559    PT Stop Time 1601    PT Time Calculation (min) 2 min             Past Medical History:  Diagnosis Date   Breast cancer (HCC)    Cervical intraepithelial neoplasm HIGH GRADE   Family history of breast cancer 02/19/2022   Family history of prostate cancer 02/19/2022   Personal history of radiation therapy    Second-degree perineal laceration, with delivery 03/27/2018   Past Surgical History:  Procedure Laterality Date   BREAST LUMPECTOMY WITH RADIOACTIVE SEED AND SENTINEL LYMPH NODE BIOPSY Right 04/04/2022   Procedure: RIGHT BREAST LUMPECTOMY WITH RADIOACTIVE SEED AND SENTINEL LYMPH NODE BIOPSY;  Surgeon: Abigail Miyamoto, MD;  Location: [facility];  Service: General;  Laterality: Right;   BREAST REDUCTION SURGERY Bilateral 04/04/2022   Procedure: RIGHT SIDE ONCOPLASTIC BREAST REDUCTION, LEFT SIDE BREAST REDUCTION FOR SYMMETRY;  Surgeon: Allena Napoleon, MD;  Location: [facility];  Service: Plastics;  Laterality: Bilateral;   INTRAUTERINE DEVICE (IUD) INSERTION     mirena iud insertion 12-23-19   IUD REMOVAL  03/02/2012   MIRENA   laser vaporization of cervix  10/14/2011   REDUCTION MAMMAPLASTY     RESET FX RIGHT ARM  AGE 60   WISDOM TOOTH EXTRACTION  03/04/2012   ORAL SURGEON OFFICE   Patient Active Problem List   Diagnosis Date Noted   Genetic testing 03/03/2022   Family history of breast cancer 02/19/2022   Family history of prostate cancer 02/19/2022   Anxiety 02/19/2022   Malignant neoplasm of upper-outer quadrant of right breast in female, estrogen receptor positive (HCC) 02/17/2022    REFERRING DIAG:  right breast cancer at risk for lymphedema  THERAPY DIAG:  Aftercare following surgery for neoplasm  PERTINENT HISTORY:  right UE Lymphedema risk, June 2023 R lumpectomy and reduction,  PRECAUTIONS: right UE Lymphedema risk, None  SUBJECTIVE: Pt returns for her 3 month L-Dex screen.   PAIN:  Are you having pain? No  SOZO SCREENING:  Patient was assessed today using the SOZO machine to determine the lymphedema index score. This was compared to her baseline score. It was determined that she is within the recommended range when compared to her baseline and no further action is needed at this time. She will continue SOZO screenings. These are done every 3 months for 2 years post operatively followed by every 6 months for 2 years, and then annually.   L-DEX FLOWSHEETS - 12/07/23 1600       L-DEX LYMPHEDEMA SCREENING   Measurement Type Unilateral    L-DEX MEASUREMENT EXTREMITY Upper Extremity    POSITION  Standing    DOMINANT SIDE Right    At Risk Side Right    BASELINE SCORE (UNILATERAL) -4.5    L-DEX SCORE (UNILATERAL) -3.1    VALUE CHANGE (UNILAT) 1.4               [employer], PT 12/07/2023, 4:02 PM

## 2023-12-17 ENCOUNTER — Other Ambulatory Visit (HOSPITAL_COMMUNITY): Payer: Self-pay

## 2023-12-22 ENCOUNTER — Other Ambulatory Visit (HOSPITAL_COMMUNITY): Payer: Self-pay

## 2024-03-21 ENCOUNTER — Ambulatory Visit: Payer: 59 | Attending: Surgery

## 2024-03-21 VITALS — Wt 196.1 lb

## 2024-03-21 DIAGNOSIS — Z483 Aftercare following surgery for neoplasm: Secondary | ICD-10-CM | POA: Insufficient documentation

## 2024-03-21 NOTE — Therapy (Signed)
  OUTPATIENT PHYSICAL THERAPY SOZO SCREENING NOTE   Patient Name: Grace Harvey MRN: [id number] DOB:1981/08/01, 43 y.o., female Today's Date: 03/21/2024  PCP: Glena Landau, MD REFERRING PROVIDER: Oza Blumenthal, MD   PT End of Session - 03/21/24 1556     Visit Number 5   # unchnaged due to screen only   PT Start Time 1555    PT Stop Time 1559    PT Time Calculation (min) 4 min    Activity Tolerance Patient tolerated treatment well    Behavior During Therapy [facility] for tasks assessed/performed             Past Medical History:  Diagnosis Date   Breast cancer (HCC)    Cervical intraepithelial neoplasm HIGH GRADE   Family history of breast cancer 02/19/2022   Family history of prostate cancer 02/19/2022   Personal history of radiation therapy    Second-degree perineal laceration, with delivery 03/27/2018   Past Surgical History:  Procedure Laterality Date   BREAST LUMPECTOMY WITH RADIOACTIVE SEED AND SENTINEL LYMPH NODE BIOPSY Right 04/04/2022   Procedure: RIGHT BREAST LUMPECTOMY WITH RADIOACTIVE SEED AND SENTINEL LYMPH NODE BIOPSY;  Surgeon: Oza Blumenthal, MD;  Location: [facility];  Service: General;  Laterality: Right;   BREAST REDUCTION SURGERY Bilateral 04/04/2022   Procedure: RIGHT SIDE ONCOPLASTIC BREAST REDUCTION, LEFT SIDE BREAST REDUCTION FOR SYMMETRY;  Surgeon: Barb Bonito, MD;  Location: [facility];  Service: Plastics;  Laterality: Bilateral;   INTRAUTERINE DEVICE (IUD) INSERTION     mirena  iud insertion 12-23-19   IUD REMOVAL  03/02/2012   MIRENA    laser vaporization of cervix  10/14/2011   REDUCTION MAMMAPLASTY     RESET FX RIGHT ARM  AGE 62   WISDOM TOOTH EXTRACTION  03/04/2012   ORAL SURGEON OFFICE   Patient Active Problem List   Diagnosis Date Noted   Genetic testing 03/03/2022   Family history of breast cancer 02/19/2022   Family history of prostate cancer 02/19/2022   Anxiety 02/19/2022   Malignant neoplasm of  upper-outer quadrant of right breast in female, estrogen receptor positive (HCC) 02/17/2022    REFERRING DIAG: right breast cancer at risk for lymphedema  THERAPY DIAG:  Aftercare following surgery for neoplasm  PERTINENT HISTORY:  right UE Lymphedema risk, June 2023 R lumpectomy and reduction,  PRECAUTIONS: right UE Lymphedema risk, None  SUBJECTIVE: Pt returns for her 3 month L-Dex screen.   PAIN:  Are you having pain? No  SOZO SCREENING:  Patient was assessed today using the SOZO machine to determine the lymphedema index score. This was compared to her baseline score. It was determined that she is within the recommended range when compared to her baseline and no further action is needed at this time. She will continue SOZO screenings. These are done every 3 months for 2 years post operatively followed by every 6 months for 2 years, and then annually.   L-DEX FLOWSHEETS - 03/21/24 1500       L-DEX LYMPHEDEMA SCREENING   Measurement Type Unilateral    L-DEX MEASUREMENT EXTREMITY Upper Extremity    POSITION  Standing    DOMINANT SIDE Right    At Risk Side Right    BASELINE SCORE (UNILATERAL) -4.5    L-DEX SCORE (UNILATERAL) -0.7    VALUE CHANGE (UNILAT) 3.8               Denyce Flank, PTA 03/21/2024, 3:58 PM

## 2024-06-06 ENCOUNTER — Other Ambulatory Visit (HOSPITAL_COMMUNITY): Payer: Self-pay

## 2024-06-09 ENCOUNTER — Other Ambulatory Visit (HOSPITAL_COMMUNITY): Payer: Self-pay

## 2024-06-09 ENCOUNTER — Telehealth: Admitting: Physician Assistant

## 2024-06-09 DIAGNOSIS — B354 Tinea corporis: Secondary | ICD-10-CM | POA: Diagnosis not present

## 2024-06-09 MED ORDER — CLOTRIMAZOLE-BETAMETHASONE 1-0.05 % EX CREA
1.0000 | TOPICAL_CREAM | Freq: Every day | CUTANEOUS | 0 refills | Status: DC
  Filled 2024-06-09: qty 30, 30d supply, fill #0

## 2024-06-09 NOTE — Progress Notes (Signed)
 I have spent 5 minutes in review of e-visit questionnaire, review and updating patient chart, medical decision making and response to patient.   Elsie Velma Lunger, PA-C

## 2024-06-09 NOTE — Progress Notes (Signed)
 E Visit for Rash  We are sorry that you are not feeling well. Here is how we plan to help!  Based upon your presentation it appears you have a fungal infection.  I have prescribed: Lotrisone  (clotrimazole -betamethasone ) to apply topically twice daily for up to 14 weeks.    HOME CARE:  Take cool showers and avoid direct sunlight. Apply cool compress or wet dressings. Take a bath in an oatmeal bath.  Sprinkle content of one Aveeno packet under running faucet with comfortably warm water .  Bathe for 15-20 minutes, 1-2 times daily.  Pat dry with a towel. Do not rub the rash. Use hydrocortisone cream. Take an antihistamine like Benadryl  for widespread rashes that itch.  The adult dose of Benadryl  is 25-50 mg by mouth 4 times daily. Caution:  This type of medication may cause sleepiness.  Do not drink alcohol, drive, or operate dangerous machinery while taking antihistamines.  Do not take these medications if you have prostate enlargement.  Read package instructions thoroughly on all medications that you take.  GET HELP RIGHT AWAY IF:  Symptoms don't go away after treatment. Severe itching that persists. If you rash spreads or swells. If you rash begins to smell. If it blisters and opens or develops a yellow-brown crust. You develop a fever. You have a sore throat. You become short of breath.  MAKE SURE YOU:  Understand these instructions. Will watch your condition. Will get help right away if you are not doing well or get worse.  Thank you for choosing an e-visit.  Your e-visit answers were reviewed by a board certified advanced clinical practitioner to complete your personal care plan. Depending upon the condition, your plan could have included both over the counter or prescription medications.  Please review your pharmacy choice. Make sure the pharmacy is open so you can pick up prescription now. If there is a problem, you may contact your provider through [employer] and have  the prescription routed to another pharmacy.  Your safety is important to us . If you have drug allergies check your prescription carefully.   For the next 24 hours you can use MyChart to ask questions about today's visit, request a non-urgent call back, or ask for a work or school excuse. You will get an email in the next two days asking about your experience. I hope that your e-visit has been valuable and will speed your recovery.

## 2024-06-14 DIAGNOSIS — I890 Lymphedema, not elsewhere classified: Secondary | ICD-10-CM | POA: Diagnosis not present

## 2024-06-14 DIAGNOSIS — F411 Generalized anxiety disorder: Secondary | ICD-10-CM | POA: Diagnosis not present

## 2024-06-14 DIAGNOSIS — Z79899 Other long term (current) drug therapy: Secondary | ICD-10-CM | POA: Diagnosis not present

## 2024-06-14 DIAGNOSIS — E669 Obesity, unspecified: Secondary | ICD-10-CM | POA: Diagnosis not present

## 2024-06-14 DIAGNOSIS — C50919 Malignant neoplasm of unspecified site of unspecified female breast: Secondary | ICD-10-CM | POA: Diagnosis not present

## 2024-06-14 DIAGNOSIS — Z0000 Encounter for general adult medical examination without abnormal findings: Secondary | ICD-10-CM | POA: Diagnosis not present

## 2024-06-20 ENCOUNTER — Ambulatory Visit: Attending: Surgery

## 2024-06-20 DIAGNOSIS — Z483 Aftercare following surgery for neoplasm: Secondary | ICD-10-CM | POA: Insufficient documentation

## 2024-09-02 ENCOUNTER — Other Ambulatory Visit: Payer: Self-pay | Admitting: Adult Health

## 2024-09-02 DIAGNOSIS — Z853 Personal history of malignant neoplasm of breast: Secondary | ICD-10-CM

## 2024-09-20 ENCOUNTER — Ambulatory Visit
Admission: RE | Admit: 2024-09-20 | Discharge: 2024-09-20 | Disposition: A | Source: Ambulatory Visit | Attending: Adult Health | Admitting: Adult Health

## 2024-09-20 DIAGNOSIS — Z853 Personal history of malignant neoplasm of breast: Secondary | ICD-10-CM

## 2024-09-20 DIAGNOSIS — R928 Other abnormal and inconclusive findings on diagnostic imaging of breast: Secondary | ICD-10-CM | POA: Diagnosis not present

## 2024-09-26 ENCOUNTER — Ambulatory Visit: Payer: 59 | Admitting: Hematology and Oncology

## 2024-10-17 ENCOUNTER — Inpatient Hospital Stay: Payer: Self-pay | Attending: Hematology and Oncology | Admitting: Hematology and Oncology

## 2024-10-17 ENCOUNTER — Other Ambulatory Visit (HOSPITAL_COMMUNITY): Payer: Self-pay

## 2024-10-17 ENCOUNTER — Other Ambulatory Visit: Payer: Self-pay

## 2024-10-17 VITALS — BP 118/84 | HR 84 | Temp 97.6°F | Resp 18 | Wt 194.6 lb

## 2024-10-17 DIAGNOSIS — Z170 Estrogen receptor positive status [ER+]: Secondary | ICD-10-CM | POA: Diagnosis not present

## 2024-10-17 DIAGNOSIS — C50411 Malignant neoplasm of upper-outer quadrant of right female breast: Secondary | ICD-10-CM

## 2024-10-17 MED ORDER — VENLAFAXINE HCL ER 75 MG PO CP24
75.0000 mg | ORAL_CAPSULE | Freq: Every day | ORAL | 3 refills | Status: AC
  Filled 2024-10-17: qty 90, 90d supply, fill #0

## 2024-10-17 MED ORDER — TAMOXIFEN CITRATE 20 MG PO TABS
20.0000 mg | ORAL_TABLET | Freq: Every day | ORAL | 3 refills | Status: AC
  Filled 2024-10-17 (×2): qty 90, 90d supply, fill #0

## 2024-10-17 NOTE — Assessment & Plan Note (Signed)
 02/10/2022:Palpable right breast mass UOQ spiculated mass at 10 o'clock position 1.8 cm, axilla negative, ultrasound-guided biopsy: Grade 2 IDC, ER 100%, PR 100%, HER2 equivocal by IHC, FISH negative, Ki-67 30%   04/04/2022:Right lumpectomy: Grade 2 IDC 3.7 cm, with DCIS intermediate grade, 0/4 lymph nodes negative, inferior margin was removed through mammoplasty and therefore all margins are clear.  ER 100%, PR 100%, Ki-67 equivocal by IHC negative by FISH, Ki-67 30%   Oncotype DX score 10: Distant recurrence: 3%   Adjuvant radiation therapy: 05/16/2022-07/03/2022 Adjuvant tamoxifen  started 07/31/2022   Tamoxifen  toxicities: Paresthesias: Numbness in the hands and feet: I suspect that the symptoms may be related to Effexor  Tremors: Also probably related to Effexor .   We cannot lower the dosage of Effexor  because she needs it for emotional balance and lower doses made her cry significantly.   Breast cancer surveillance: Mammogram 09/20/2024: Benign breast density category C Breast exam: 10/17/2024: Benign   Return to clinic in 1 year for follow-up

## 2024-10-17 NOTE — Progress Notes (Signed)
 "  Patient Care Team: Loreli Kins, MD as PCP - General (Family Medicine) Vernetta Berg, MD as Consulting Physician (General Surgery) Odean Potts, MD as Consulting Physician (Hematology and Oncology) Dewey Rush, MD as Consulting Physician (Radiation Oncology)  DIAGNOSIS:  Encounter Diagnosis  Name Primary?   Malignant neoplasm of upper-outer quadrant of right breast in female, estrogen receptor positive (HCC) Yes    SUMMARY OF ONCOLOGIC HISTORY: Oncology History  Malignant neoplasm of upper-outer quadrant of right breast in female, estrogen receptor positive (HCC)  02/10/2022 Initial Diagnosis   Palpable right breast mass UOQ spiculated mass at 10 o'clock position 1.8 cm, axilla negative, ultrasound-guided biopsy: Grade 2 IDC, ER 100%, PR 100%, HER2 equivocal by IHC, FISH negative, Ki-67 30%   02/19/2022 Cancer Staging   Staging form: Breast, AJCC 8th Edition - Clinical: Stage IA (cT1c, cN0, cM0, G2, ER+, PR+, HER2-) - Signed by Odean Potts, MD on 02/19/2022 Stage prefix: Initial diagnosis Histologic grading system: 3 grade system   02/28/2022 Genetic Testing   Negative hereditary cancer genetic testing: no pathogenic variants detected in [employer].  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  Report dates are 02/28/2022 and Mar 04, 2022.   The CustomNext-Cancer+RNAinsight panel offered by [employer] includes sequencing and rearrangement analysis for the following 47 genes:  APC, ATM, AXIN2, BARD1, BMPR1A, BRCA1, BRCA2, BRIP1, CDH1, CDK4, CDKN2A, CHEK2, DICER1, EPCAM, GREM1, HOXB13, MEN1, MLH1, MSH2, MSH3, MSH6, MUTYH, NBN, NF1, NF2, NTHL1, PALB2, PMS2, POLD1, POLE, PTEN, RAD51C, RAD51D, RECQL, RET, SDHA, SDHAF2, SDHB, SDHC, SDHD, SMAD4, SMARCA4, STK11, TP53, TSC1, TSC2, and VHL.  RNA data is routinely analyzed for use in variant interpretation for all genes.   04/04/2022 Surgery   Right lumpectomy: Grade 2 IDC 3.7 cm, with DCIS  intermediate grade, 0/4 lymph nodes negative, inferior margin was removed through mammoplasty and therefore all margins are clear.  ER 100%, PR 100%, Ki-67 equivocal by IHC negative by FISH, Ki-67 30%   04/14/2022 Oncotype testing   Oncotype score 10, risk of recurrence: 3%   05/16/2022 - 07/03/2022 Radiation Therapy   Site Technique Total Dose (Gy) Dose per Fx (Gy) Completed Fx Beam Energies  Breast, Right: Breast_R 3D 50.4/50.4 1.8 28/28 6XFFF  Breast, Right: Breast_R_Bst 3D 10/10 2 5/5 6X     07/31/2022 -  Anti-estrogen oral therapy   Tamoxifen      CHIEF COMPLIANT: Surveillance of breast cancer  HISTORY OF PRESENT ILLNESS:  History of Present Illness Grace Harvey is a 44 year old female with stage IA ER/PR-positive, HER2-negative invasive ductal carcinoma of the right breast, status post lumpectomy and adjuvant radiation, presenting for routine oncology surveillance.  She underwent right breast lumpectomy in June 2023 and completed adjuvant 3D conformal radiation in September 2023. She is on adjuvant tamoxifen  for over one year, is tolerating it well, and denies new or worsening oncologic symptoms. Her most recent surveillance mammogram was one month ago.  She reports resolution of prior emotional lability and cognitive symptoms that she associated with tamoxifen  and radiation. She started a new job with increased mental engagement and feels her emotional and cognitive function are much improved. Venlafaxine  effectively controls hot flashes. She denies sleep disturbance and uses melatonin only occasionally for mild nocturnal somnolence from tamoxifen .  She has mild joint pain and fatigue that are controlled with vitamin D and B12 supplementation and prioritizing 8 to 9 hours of sleep nightly, which has improved her energy.  Post-surgical symptoms include decreased range of motion, pulling sensation,  and occasional numbness in the right arm. She manages these with Pilates and massage and  declines physical therapy. She notes no swelling or lymphedema and feels these symptoms are controlled.       ALLERGIES:  has no known allergies.  MEDICATIONS:  Current Outpatient Medications  Medication Sig Dispense Refill   tamoxifen  (NOLVADEX ) 20 MG tablet Take 1 tablet (20 mg total) by mouth daily. 90 tablet 3   venlafaxine  XR (EFFEXOR -XR) 75 MG 24 hr capsule Take 1 capsule (75 mg total) by mouth daily with food. 90 capsule 3   No current facility-administered medications for this visit.    PHYSICAL EXAMINATION: ECOG PERFORMANCE STATUS: 1 - Symptomatic but completely ambulatory  Vitals:   10/17/24 1040  BP: 118/84  Pulse: 84  Resp: 18  Temp: 97.6 F (36.4 C)  SpO2: 97%   Filed Weights   10/17/24 1040  Weight: 194 lb 9.6 oz (88.3 kg)   LABORATORY DATA:  I have reviewed the data as listed    Latest Ref Rng & Units 02/19/2022   12:12 PM 06/11/2015   11:17 AM  CMP  Glucose 70 - 99 mg/dL 98  85   BUN 6 - 20 mg/dL 19  11   Creatinine 9.55 - 1.00 mg/dL 9.25  9.26   Sodium 864 - 145 mmol/L 137  141   Potassium 3.5 - 5.1 mmol/L 3.8  4.3   Chloride 98 - 111 mmol/L 106  105   CO2 22 - 32 mmol/L 24  26   Calcium 8.9 - 10.3 mg/dL 9.3  9.2   Total Protein 6.5 - 8.1 g/dL 7.6  7.0   Total Bilirubin 0.3 - 1.2 mg/dL 0.5  0.5   Alkaline Phos 38 - 126 U/L 45  46   AST 15 - 41 U/L 12  12   ALT 0 - 44 U/L 14  9     Lab Results  Component Value Date   WBC 6.9 02/19/2022   HGB 14.5 02/19/2022   HCT 42.6 02/19/2022   MCV 88.2 02/19/2022   PLT 306 02/19/2022   NEUTROABS 4.0 02/19/2022    ASSESSMENT & PLAN:  Malignant neoplasm of upper-outer quadrant of right breast in female, estrogen receptor positive (HCC) 02/10/2022:Palpable right breast mass UOQ spiculated mass at 10 o'clock position 1.8 cm, axilla negative, ultrasound-guided biopsy: Grade 2 IDC, ER 100%, PR 100%, HER2 equivocal by IHC, FISH negative, Ki-67 30%   04/04/2022:Right lumpectomy: Grade 2 IDC 3.7 cm, with DCIS  intermediate grade, 0/4 lymph nodes negative, inferior margin was removed through mammoplasty and therefore all margins are clear.  ER 100%, PR 100%, Ki-67 equivocal by IHC negative by FISH, Ki-67 30%   Oncotype DX score 10: Distant recurrence: 3%   Adjuvant radiation therapy: 05/16/2022-07/03/2022 Adjuvant tamoxifen  started 07/31/2022   Tamoxifen  toxicities: Paresthesias: Numbness in the hands and feet: I suspect that the symptoms may be related to Effexor  Tremors: Also probably related to Effexor .   We cannot lower the dosage of Effexor  because she needs it for emotional balance and lower doses made her cry significantly.   Breast cancer surveillance: Mammogram 09/20/2024: Benign breast density category C Breast exam: 10/17/2024: Benign Guardant reveal for MRD testing: Will be ordered   Return to clinic in 1 year for follow-up ------------------------------------- Assessment and Plan Assessment & Plan Estrogen receptor positive breast cancer of the right upper-outer quadrant in remission Tolerating tamoxifen  with manageable side effects. Consent obtained for additional monitoring with circulating tumor DNA testing. -  Discussed and offered Guardant circulating tumor DNA blood test; she agreed to proceed. - Arranged for Guardant blood test kit and home blood draw. - Continue annual oncology follow-up and annual mammogram. - Continue blood work every six months.  Menopausal symptoms secondary to tamoxifen  therapy Menopausal symptoms controlled with venlafaxine  XR and sleep hygiene. Emotional lability, fatigue, and arthralgia manageable. No severe side effects necessitating discontinuation of tamoxifen . - Refilled venlafaxine  XR 75 mg daily. - Continue tamoxifen  as tolerated.  Post-surgical arm paresthesia and limited range of motion Chronic post-surgical arm paresthesia and limited range of motion managed with Pilates and massage. No swelling or acute changes. Declines physical therapy. -  Encouraged continued Pilates and massage for symptom management. - No referral to physical therapy at this time.      No orders of the defined types were placed in this encounter.  The patient has a good understanding of the overall plan. she agrees with it. she will call with any problems that may develop before the next visit here.  I personally spent a total of 30 minutes in the care of the patient today including preparing to see the patient, getting/reviewing separately obtained history, performing a medically appropriate exam/evaluation, counseling and educating, placing orders, referring and communicating with other health care professionals, documenting clinical information in the EHR, independently interpreting results, communicating results, and coordinating care.   Viinay K Grace Manke, MD 10/17/2024    "

## 2024-10-22 ENCOUNTER — Other Ambulatory Visit: Payer: Self-pay

## 2024-10-22 ENCOUNTER — Other Ambulatory Visit (HOSPITAL_COMMUNITY): Payer: Self-pay

## 2024-10-31 ENCOUNTER — Encounter: Payer: Self-pay | Admitting: *Deleted

## 2024-10-31 NOTE — Progress Notes (Signed)
 Per MD request RN placed order for Guardant Reveal via their portal.

## 2025-10-17 ENCOUNTER — Inpatient Hospital Stay: Admitting: Hematology and Oncology
